# Patient Record
Sex: Male | Born: 1975 | Race: Black or African American | Hispanic: No | Marital: Married | Smoking: Current some day smoker
Health system: Southern US, Community
[De-identification: ages and names within clinical notes are randomized; demographics above are authoritative.]

## PROBLEM LIST (undated history)

## (undated) DIAGNOSIS — S82209A Unspecified fracture of shaft of unspecified tibia, initial encounter for closed fracture: Secondary | ICD-10-CM

## (undated) DIAGNOSIS — T148XXA Other injury of unspecified body region, initial encounter: Secondary | ICD-10-CM

## (undated) HISTORY — PX: NO PAST SURGERIES: SHX2092

---

## 2016-12-15 ENCOUNTER — Emergency Department (HOSPITAL_COMMUNITY): Payer: 59

## 2016-12-15 ENCOUNTER — Encounter (HOSPITAL_COMMUNITY): Payer: Self-pay | Admitting: Emergency Medicine

## 2016-12-15 ENCOUNTER — Inpatient Hospital Stay (HOSPITAL_COMMUNITY)
Admission: EM | Admit: 2016-12-15 | Discharge: 2016-12-24 | DRG: 493 | Disposition: A | Discharge status: Home Health | Payer: 59 | Attending: Orthopedic Surgery | Admitting: Orthopedic Surgery

## 2016-12-15 DIAGNOSIS — S060X9A Concussion with loss of consciousness of unspecified duration, initial encounter: Secondary | ICD-10-CM | POA: Diagnosis present

## 2016-12-15 DIAGNOSIS — S40812A Abrasion of left upper arm, initial encounter: Secondary | ICD-10-CM | POA: Diagnosis present

## 2016-12-15 DIAGNOSIS — Z7982 Long term (current) use of aspirin: Secondary | ICD-10-CM

## 2016-12-15 DIAGNOSIS — T148XXA Other injury of unspecified body region, initial encounter: Secondary | ICD-10-CM | POA: Diagnosis present

## 2016-12-15 DIAGNOSIS — S0083XA Contusion of other part of head, initial encounter: Secondary | ICD-10-CM | POA: Diagnosis present

## 2016-12-15 DIAGNOSIS — F17290 Nicotine dependence, other tobacco product, uncomplicated: Secondary | ICD-10-CM | POA: Diagnosis present

## 2016-12-15 DIAGNOSIS — Z419 Encounter for procedure for purposes other than remedying health state, unspecified: Secondary | ICD-10-CM

## 2016-12-15 DIAGNOSIS — T1490XA Injury, unspecified, initial encounter: Secondary | ICD-10-CM

## 2016-12-15 DIAGNOSIS — S82141A Displaced bicondylar fracture of right tibia, initial encounter for closed fracture: Principal | ICD-10-CM | POA: Diagnosis present

## 2016-12-15 DIAGNOSIS — S0031XA Abrasion of nose, initial encounter: Secondary | ICD-10-CM | POA: Diagnosis present

## 2016-12-15 DIAGNOSIS — S82121A Displaced fracture of lateral condyle of right tibia, initial encounter for closed fracture: Secondary | ICD-10-CM

## 2016-12-15 DIAGNOSIS — S80811A Abrasion, right lower leg, initial encounter: Secondary | ICD-10-CM | POA: Diagnosis present

## 2016-12-15 DIAGNOSIS — S82201A Unspecified fracture of shaft of right tibia, initial encounter for closed fracture: Secondary | ICD-10-CM

## 2016-12-15 DIAGNOSIS — Y92481 Parking lot as the place of occurrence of the external cause: Secondary | ICD-10-CM | POA: Diagnosis not present

## 2016-12-15 DIAGNOSIS — S40811A Abrasion of right upper arm, initial encounter: Secondary | ICD-10-CM | POA: Diagnosis present

## 2016-12-15 DIAGNOSIS — S80812A Abrasion, left lower leg, initial encounter: Secondary | ICD-10-CM | POA: Diagnosis present

## 2016-12-15 DIAGNOSIS — D62 Acute posthemorrhagic anemia: Secondary | ICD-10-CM | POA: Diagnosis not present

## 2016-12-15 DIAGNOSIS — K5900 Constipation, unspecified: Secondary | ICD-10-CM | POA: Diagnosis not present

## 2016-12-15 DIAGNOSIS — Z23 Encounter for immunization: Secondary | ICD-10-CM

## 2016-12-15 DIAGNOSIS — M25512 Pain in left shoulder: Secondary | ICD-10-CM | POA: Diagnosis present

## 2016-12-15 DIAGNOSIS — T07XXXA Unspecified multiple injuries, initial encounter: Secondary | ICD-10-CM

## 2016-12-15 DIAGNOSIS — S82209A Unspecified fracture of shaft of unspecified tibia, initial encounter for closed fracture: Secondary | ICD-10-CM

## 2016-12-15 HISTORY — DX: Unspecified fracture of shaft of unspecified tibia, initial encounter for closed fracture: S82.209A

## 2016-12-15 HISTORY — DX: Other injury of unspecified body region, initial encounter: T14.8XXA

## 2016-12-15 LAB — COMPREHENSIVE METABOLIC PANEL
ALBUMIN: 4.2 g/dL (ref 3.5–5.0)
ALK PHOS: 40 U/L (ref 38–126)
ALT: 46 U/L (ref 17–63)
AST: 61 U/L — AB (ref 15–41)
Anion gap: 11 (ref 5–15)
BILIRUBIN TOTAL: 0.8 mg/dL (ref 0.3–1.2)
BUN: 10 mg/dL (ref 6–20)
CO2: 22 mmol/L (ref 22–32)
CREATININE: 1.23 mg/dL (ref 0.61–1.24)
Calcium: 9.3 mg/dL (ref 8.9–10.3)
Chloride: 104 mmol/L (ref 101–111)
GFR calc Af Amer: >60 mL/min (ref >60)
Glucose, Bld: 107 mg/dL — ABNORMAL HIGH (ref 65–99)
POTASSIUM: 3.3 mmol/L — AB (ref 3.5–5.1)
Sodium: 137 mmol/L (ref 135–145)
TOTAL PROTEIN: 7.2 g/dL (ref 6.5–8.1)

## 2016-12-15 LAB — CBC WITH DIFFERENTIAL/PLATELET
BASOS ABS: 0 10*3/uL (ref 0.0–0.1)
Basophils Relative: 0 %
EOS ABS: 0.1 10*3/uL (ref 0.0–0.7)
EOS PCT: 1 %
HCT: 43.8 % (ref 39.0–52.0)
Hemoglobin: 14.6 g/dL (ref 13.0–17.0)
LYMPHS PCT: 41 %
Lymphs Abs: 3.7 10*3/uL (ref 0.7–4.0)
MCH: 28.1 pg (ref 26.0–34.0)
MCHC: 33.3 g/dL (ref 30.0–36.0)
MCV: 84.2 fL (ref 78.0–100.0)
MONO ABS: 0.7 10*3/uL (ref 0.1–1.0)
Monocytes Relative: 7 %
Neutro Abs: 4.6 10*3/uL (ref 1.7–7.7)
Neutrophils Relative %: 51 %
PLATELETS: 162 10*3/uL (ref 150–400)
RBC: 5.2 MIL/uL (ref 4.22–5.81)
RDW: 13.7 % (ref 11.5–15.5)
WBC: 9 10*3/uL (ref 4.0–10.5)

## 2016-12-15 LAB — I-STAT CHEM 8, ED
BUN: 13 mg/dL (ref 6–20)
CREATININE: 1.2 mg/dL (ref 0.61–1.24)
Calcium, Ion: 1.02 mmol/L — ABNORMAL LOW (ref 1.15–1.40)
Chloride: 103 mmol/L (ref 101–111)
GLUCOSE: 105 mg/dL — AB (ref 65–99)
HEMATOCRIT: 46 % (ref 39.0–52.0)
HEMOGLOBIN: 15.6 g/dL (ref 13.0–17.0)
Potassium: 3.2 mmol/L — ABNORMAL LOW (ref 3.5–5.1)
Sodium: 138 mmol/L (ref 135–145)
TCO2: 23 mmol/L (ref 0–100)

## 2016-12-15 LAB — I-STAT CG4 LACTIC ACID, ED: Lactic Acid, Venous: 3.07 mmol/L (ref 0.5–1.9)

## 2016-12-15 LAB — ETHANOL: ALCOHOL ETHYL (B): 32 mg/dL — AB (ref <5)

## 2016-12-15 MED ORDER — FENTANYL CITRATE (PF) 100 MCG/2ML IJ SOLN
100.0000 ug | Freq: Once | INTRAMUSCULAR | Status: AC
  Administered 2016-12-15: 100 ug via INTRAVENOUS

## 2016-12-15 MED ORDER — HYDROMORPHONE HCL 1 MG/ML IJ SOLN
1.0000 mg | Freq: Once | INTRAMUSCULAR | Status: AC
  Administered 2016-12-15: 1 mg via INTRAVENOUS

## 2016-12-15 MED ORDER — TETANUS-DIPHTH-ACELL PERTUSSIS 5-2.5-18.5 LF-MCG/0.5 IM SUSP
0.5000 mL | Freq: Once | INTRAMUSCULAR | Status: AC
  Administered 2016-12-15: 0.5 mL via INTRAMUSCULAR

## 2016-12-15 MED ORDER — CEFAZOLIN SODIUM-DEXTROSE 1-4 GM/50ML-% IV SOLN
1.0000 g | Freq: Once | INTRAVENOUS | Status: AC
  Administered 2016-12-15: 1 g via INTRAVENOUS
  Filled 2016-12-15: qty 50

## 2016-12-15 MED ORDER — ONDANSETRON HCL 4 MG/2ML IJ SOLN
INTRAMUSCULAR | Status: AC
  Filled 2016-12-15: qty 2

## 2016-12-15 MED ORDER — ONDANSETRON HCL 4 MG/2ML IJ SOLN
4.0000 mg | Freq: Once | INTRAMUSCULAR | Status: AC
  Administered 2016-12-15: 4 mg via INTRAVENOUS

## 2016-12-15 MED ORDER — HYDROMORPHONE HCL 1 MG/ML IJ SOLN
INTRAMUSCULAR | Status: AC
  Filled 2016-12-15: qty 1

## 2016-12-15 NOTE — ED Notes (Signed)
Pt requesting pain meds again.  Dr. Rhunette CroftNanavati notified again and is now at bedside.
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
## 2016-12-15 NOTE — ED Notes (Signed)
Pts Wife called she is on the way from [place], will be here in about 6 hrs. Told her nurse will call to give update once the pt was stable, Jordan Furlongtara Townsend [phone].
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
## 2016-12-15 NOTE — ED Notes (Signed)
Portable x-rays being completed at this time. 

## 2016-12-15 NOTE — ED Notes (Signed)
Pt c/o nausea.  

## 2016-12-15 NOTE — ED Notes (Signed)
Pt to xray

## 2016-12-15 NOTE — ED Notes (Signed)
Returned from CT.

## 2016-12-15 NOTE — ED Notes (Addendum)
Ice pack applied to R knee @ 2240

## 2016-12-15 NOTE — ED Notes (Signed)
Pt to CT.  Wife has been notified and [facility]  Co-worker came to bedside to speak to pt prior to CT

## 2016-12-15 NOTE — ED Notes (Signed)
Pt requesting more pain medication.  Dr. Rhunette CroftNanavati notified of same.  Pt also asked for his R leg to be slid over in the bed.  I told pt he could move leg over and he states he is unable to move it.  I encouraged pt to move leg over and informed him that he was moving it earlier.  He states that he hurts too bad to move it.  Pt wiggles toes and sensation intact.  Dr. Rhunette CroftNanavati updated.
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
## 2016-12-16 ENCOUNTER — Inpatient Hospital Stay (HOSPITAL_COMMUNITY): Payer: 59

## 2016-12-16 ENCOUNTER — Encounter (HOSPITAL_COMMUNITY)
Admission: EM | Disposition: A | Discharge status: Home Health | Payer: Self-pay | Source: Home / Self Care | Attending: Orthopedic Surgery

## 2016-12-16 ENCOUNTER — Inpatient Hospital Stay (HOSPITAL_COMMUNITY): Payer: 59 | Admitting: Certified Registered Nurse Anesthetist

## 2016-12-16 ENCOUNTER — Encounter (HOSPITAL_COMMUNITY): Payer: Self-pay | Admitting: *Deleted

## 2016-12-16 ENCOUNTER — Emergency Department (HOSPITAL_COMMUNITY): Payer: 59

## 2016-12-16 DIAGNOSIS — T1490XA Injury, unspecified, initial encounter: Secondary | ICD-10-CM | POA: Diagnosis not present

## 2016-12-16 DIAGNOSIS — S82141A Displaced bicondylar fracture of right tibia, initial encounter for closed fracture: Secondary | ICD-10-CM | POA: Diagnosis not present

## 2016-12-16 DIAGNOSIS — S82121A Displaced fracture of lateral condyle of right tibia, initial encounter for closed fracture: Secondary | ICD-10-CM

## 2016-12-16 DIAGNOSIS — M25512 Pain in left shoulder: Secondary | ICD-10-CM | POA: Diagnosis present

## 2016-12-16 DIAGNOSIS — S82209A Unspecified fracture of shaft of unspecified tibia, initial encounter for closed fracture: Secondary | ICD-10-CM | POA: Insufficient documentation

## 2016-12-16 HISTORY — PX: EXTERNAL FIXATION LEG: SHX1549

## 2016-12-16 LAB — CBC
HCT: 38.4 % — ABNORMAL LOW (ref 39.0–52.0)
Hemoglobin: 12.9 g/dL — ABNORMAL LOW (ref 13.0–17.0)
MCH: 28.2 pg (ref 26.0–34.0)
MCHC: 33.6 g/dL (ref 30.0–36.0)
MCV: 84 fL (ref 78.0–100.0)
PLATELETS: 160 10*3/uL (ref 150–400)
RBC: 4.57 MIL/uL (ref 4.22–5.81)
RDW: 13.8 % (ref 11.5–15.5)
WBC: 10.9 10*3/uL — AB (ref 4.0–10.5)

## 2016-12-16 LAB — SURGICAL PCR SCREEN
MRSA, PCR: NEGATIVE
STAPHYLOCOCCUS AUREUS: POSITIVE — AB

## 2016-12-16 LAB — CREATININE, SERUM
CREATININE: 1.26 mg/dL — AB (ref 0.61–1.24)
GFR calc non Af Amer: >60 mL/min (ref >60)

## 2016-12-16 LAB — HIV ANTIBODY (ROUTINE TESTING W REFLEX): HIV Screen 4th Generation wRfx: NONREACTIVE

## 2016-12-16 SURGERY — EXTERNAL FIXATION, LOWER EXTREMITY
Anesthesia: General | Site: Leg Lower | Laterality: Right

## 2016-12-16 MED ORDER — DEXAMETHASONE SODIUM PHOSPHATE 10 MG/ML IJ SOLN
INTRAMUSCULAR | Status: AC
  Filled 2016-12-16: qty 1

## 2016-12-16 MED ORDER — OXYCODONE-ACETAMINOPHEN 5-325 MG PO TABS: 1.0000 | ORAL_TABLET | ORAL | 0 refills | Status: AC | PRN

## 2016-12-16 MED ORDER — ONDANSETRON HCL 4 MG/2ML IJ SOLN: 4.0000 mg | Freq: Four times a day (QID) | INTRAMUSCULAR | Status: DC | PRN

## 2016-12-16 MED ORDER — SORBITOL 70 % SOLN
30.0000 mL | Freq: Every day | Status: DC | PRN
  Administered 2016-12-22: 30 mL via ORAL
  Filled 2016-12-16: qty 30

## 2016-12-16 MED ORDER — DEXAMETHASONE SODIUM PHOSPHATE 10 MG/ML IJ SOLN
INTRAMUSCULAR | Status: DC | PRN
  Administered 2016-12-16: 10 mg via INTRAVENOUS

## 2016-12-16 MED ORDER — ASPIRIN EC 325 MG PO TBEC: 325.0000 mg | DELAYED_RELEASE_TABLET | Freq: Every day | ORAL | 0 refills | Status: AC

## 2016-12-16 MED ORDER — DEXTROSE 5 % IV SOLN
500.0000 mg | Freq: Four times a day (QID) | INTRAVENOUS | Status: DC | PRN
  Filled 2016-12-16: qty 5

## 2016-12-16 MED ORDER — FENTANYL CITRATE (PF) 250 MCG/5ML IJ SOLN
INTRAMUSCULAR | Status: AC
  Filled 2016-12-16: qty 5

## 2016-12-16 MED ORDER — DOCUSATE SODIUM 100 MG PO CAPS
100.0000 mg | ORAL_CAPSULE | Freq: Two times a day (BID) | ORAL | Status: DC
Start: 2016-12-16 — End: 2016-12-24
  Administered 2016-12-16 – 2016-12-24 (×15): 100 mg via ORAL
  Filled 2016-12-16 (×16): qty 1

## 2016-12-16 MED ORDER — LIDOCAINE 2% (20 MG/ML) 5 ML SYRINGE
INTRAMUSCULAR | Status: DC | PRN
  Administered 2016-12-16: 100 mg via INTRAVENOUS

## 2016-12-16 MED ORDER — CEFAZOLIN SODIUM-DEXTROSE 2-4 GM/100ML-% IV SOLN
INTRAVENOUS | Status: AC
  Filled 2016-12-16: qty 100

## 2016-12-16 MED ORDER — SUCCINYLCHOLINE CHLORIDE 200 MG/10ML IV SOSY
PREFILLED_SYRINGE | INTRAVENOUS | Status: AC
Start: 2016-12-16 — End: 2016-12-16
  Filled 2016-12-16: qty 10

## 2016-12-16 MED ORDER — PROPOFOL 10 MG/ML IV BOLUS
INTRAVENOUS | Status: AC
  Filled 2016-12-16: qty 20

## 2016-12-16 MED ORDER — ONDANSETRON HCL 4 MG PO TABS: 4.0000 mg | ORAL_TABLET | Freq: Three times a day (TID) | ORAL | 0 refills | Status: AC | PRN

## 2016-12-16 MED ORDER — MORPHINE SULFATE (PF) 4 MG/ML IV SOLN
2.0000 mg | INTRAVENOUS | Status: DC | PRN
  Administered 2016-12-16 (×2): 2 mg via INTRAVENOUS
  Filled 2016-12-16 (×2): qty 1

## 2016-12-16 MED ORDER — ACETAMINOPHEN 325 MG PO TABS
650.0000 mg | ORAL_TABLET | Freq: Four times a day (QID) | ORAL | Status: DC | PRN
  Administered 2016-12-20 – 2016-12-23 (×2): 650 mg via ORAL
  Filled 2016-12-16 (×3): qty 2

## 2016-12-16 MED ORDER — LACTATED RINGERS IV SOLN
INTRAVENOUS | Status: DC
  Administered 2016-12-16 – 2016-12-21 (×10): via INTRAVENOUS

## 2016-12-16 MED ORDER — ENOXAPARIN SODIUM 30 MG/0.3ML ~~LOC~~ SOLN: 30.0000 mg | Freq: Two times a day (BID) | SUBCUTANEOUS | Status: DC

## 2016-12-16 MED ORDER — DIPHENHYDRAMINE HCL 12.5 MG/5ML PO ELIX: 12.5000 mg | ORAL_SOLUTION | ORAL | Status: DC | PRN

## 2016-12-16 MED ORDER — OXYCODONE HCL 5 MG PO TABS
ORAL_TABLET | ORAL | Status: AC
  Filled 2016-12-16: qty 2

## 2016-12-16 MED ORDER — METHOCARBAMOL 500 MG PO TABS: 500.0000 mg | ORAL_TABLET | Freq: Four times a day (QID) | ORAL | 0 refills | Status: AC | PRN

## 2016-12-16 MED ORDER — LACTATED RINGERS IV SOLN
INTRAVENOUS | Status: DC
  Administered 2016-12-16: 04:00:00 via INTRAVENOUS

## 2016-12-16 MED ORDER — OXYCODONE HCL 5 MG PO TABS
ORAL_TABLET | ORAL | Status: AC
  Filled 2016-12-16: qty 1

## 2016-12-16 MED ORDER — ROCURONIUM BROMIDE 10 MG/ML (PF) SYRINGE
PREFILLED_SYRINGE | INTRAVENOUS | Status: AC
  Filled 2016-12-16: qty 5

## 2016-12-16 MED ORDER — METOCLOPRAMIDE HCL 5 MG/ML IJ SOLN: 5.0000 mg | Freq: Three times a day (TID) | INTRAMUSCULAR | Status: DC | PRN

## 2016-12-16 MED ORDER — KETOROLAC TROMETHAMINE 15 MG/ML IJ SOLN
15.0000 mg | Freq: Four times a day (QID) | INTRAMUSCULAR | Status: AC | PRN
  Administered 2016-12-16 – 2016-12-19 (×2): 15 mg via INTRAVENOUS
  Filled 2016-12-16: qty 1

## 2016-12-16 MED ORDER — LACTATED RINGERS IV SOLN
INTRAVENOUS | Status: DC
  Administered 2016-12-16: 11:00:00 via INTRAVENOUS

## 2016-12-16 MED ORDER — METHOCARBAMOL 500 MG PO TABS
500.0000 mg | ORAL_TABLET | Freq: Four times a day (QID) | ORAL | Status: DC | PRN
  Administered 2016-12-16: 500 mg via ORAL
  Filled 2016-12-16: qty 1

## 2016-12-16 MED ORDER — SUCCINYLCHOLINE CHLORIDE 200 MG/10ML IV SOSY
PREFILLED_SYRINGE | INTRAVENOUS | Status: DC | PRN
  Administered 2016-12-16: 120 mg via INTRAVENOUS

## 2016-12-16 MED ORDER — POLYETHYLENE GLYCOL 3350 17 G PO PACK
17.0000 g | PACK | Freq: Every day | ORAL | Status: DC | PRN
  Administered 2016-12-18: 17 g via ORAL
  Filled 2016-12-16: qty 1

## 2016-12-16 MED ORDER — MUPIROCIN 2 % EX OINT
TOPICAL_OINTMENT | Freq: Two times a day (BID) | CUTANEOUS | Status: DC
  Administered 2016-12-16 – 2016-12-20 (×7): via NASAL
  Administered 2016-12-20: 1 via NASAL
  Administered 2016-12-22 – 2016-12-24 (×5): via NASAL
  Filled 2016-12-16 (×2): qty 22

## 2016-12-16 MED ORDER — CEFAZOLIN SODIUM-DEXTROSE 2-4 GM/100ML-% IV SOLN
2.0000 g | Freq: Four times a day (QID) | INTRAVENOUS | Status: AC
  Administered 2016-12-16 – 2016-12-17 (×3): 2 g via INTRAVENOUS
  Filled 2016-12-16 (×3): qty 100

## 2016-12-16 MED ORDER — SENNA 8.6 MG PO TABS
1.0000 | ORAL_TABLET | Freq: Two times a day (BID) | ORAL | Status: DC
  Administered 2016-12-16 – 2016-12-24 (×14): 8.6 mg via ORAL
  Filled 2016-12-16 (×14): qty 1

## 2016-12-16 MED ORDER — SUGAMMADEX SODIUM 200 MG/2ML IV SOLN
INTRAVENOUS | Status: DC | PRN
  Administered 2016-12-16: 200 mg via INTRAVENOUS

## 2016-12-16 MED ORDER — METOCLOPRAMIDE HCL 5 MG PO TABS: 5.0000 mg | ORAL_TABLET | Freq: Three times a day (TID) | ORAL | Status: DC | PRN

## 2016-12-16 MED ORDER — CLONIDINE HCL 0.2 MG PO TABS: 0.2000 mg | ORAL_TABLET | Freq: Four times a day (QID) | ORAL | Status: DC | PRN

## 2016-12-16 MED ORDER — ROCURONIUM BROMIDE 10 MG/ML (PF) SYRINGE
PREFILLED_SYRINGE | INTRAVENOUS | Status: DC | PRN
  Administered 2016-12-16: 50 mg via INTRAVENOUS

## 2016-12-16 MED ORDER — ONDANSETRON HCL 4 MG PO TABS: 4.0000 mg | ORAL_TABLET | Freq: Four times a day (QID) | ORAL | Status: DC | PRN

## 2016-12-16 MED ORDER — ONDANSETRON HCL 4 MG/2ML IJ SOLN
INTRAMUSCULAR | Status: AC
  Filled 2016-12-16: qty 2

## 2016-12-16 MED ORDER — HYDROMORPHONE HCL 1 MG/ML IJ SOLN
0.5000 mg | INTRAMUSCULAR | Status: DC | PRN
  Administered 2016-12-16 – 2016-12-19 (×2): 1 mg via INTRAVENOUS
  Administered 2016-12-21: 0.5 mg via INTRAVENOUS
  Filled 2016-12-16 (×2): qty 1

## 2016-12-16 MED ORDER — ENOXAPARIN SODIUM 40 MG/0.4ML ~~LOC~~ SOLN
40.0000 mg | SUBCUTANEOUS | Status: DC
  Administered 2016-12-17 – 2016-12-24 (×7): 40 mg via SUBCUTANEOUS
  Filled 2016-12-16 (×7): qty 0.4

## 2016-12-16 MED ORDER — FENTANYL CITRATE (PF) 100 MCG/2ML IJ SOLN
INTRAMUSCULAR | Status: DC | PRN
  Administered 2016-12-16 (×4): 50 ug via INTRAVENOUS

## 2016-12-16 MED ORDER — PROPOFOL 10 MG/ML IV BOLUS
INTRAVENOUS | Status: DC | PRN
Start: 2016-12-16 — End: 2016-12-16
  Administered 2016-12-16: 200 mg via INTRAVENOUS

## 2016-12-16 MED ORDER — MIDAZOLAM HCL 2 MG/2ML IJ SOLN
INTRAMUSCULAR | Status: AC
  Filled 2016-12-16: qty 2

## 2016-12-16 MED ORDER — POVIDONE-IODINE 10 % EX SWAB: 2.0000 "application " | Freq: Once | CUTANEOUS | Status: DC

## 2016-12-16 MED ORDER — CEFAZOLIN SODIUM-DEXTROSE 2-4 GM/100ML-% IV SOLN
2.0000 g | INTRAVENOUS | Status: AC
  Administered 2016-12-16: 2 g via INTRAVENOUS
  Filled 2016-12-16: qty 100

## 2016-12-16 MED ORDER — ACETAMINOPHEN 650 MG RE SUPP: 650.0000 mg | Freq: Four times a day (QID) | RECTAL | Status: DC | PRN

## 2016-12-16 MED ORDER — ACETAMINOPHEN 500 MG PO TABS
1000.0000 mg | ORAL_TABLET | Freq: Three times a day (TID) | ORAL | Status: AC
  Administered 2016-12-16 – 2016-12-17 (×3): 1000 mg via ORAL
  Filled 2016-12-16 (×3): qty 2

## 2016-12-16 MED ORDER — METHOCARBAMOL 1000 MG/10ML IJ SOLN
500.0000 mg | Freq: Four times a day (QID) | INTRAVENOUS | Status: DC | PRN
  Filled 2016-12-16: qty 5

## 2016-12-16 MED ORDER — SUGAMMADEX SODIUM 200 MG/2ML IV SOLN
INTRAVENOUS | Status: AC
  Filled 2016-12-16: qty 2

## 2016-12-16 MED ORDER — FENTANYL CITRATE (PF) 100 MCG/2ML IJ SOLN
INTRAMUSCULAR | Status: AC
  Filled 2016-12-16: qty 2

## 2016-12-16 MED ORDER — SODIUM CHLORIDE 0.9 % IR SOLN
Status: DC | PRN
  Administered 2016-12-16: 1000 mL

## 2016-12-16 MED ORDER — MAGNESIUM CITRATE PO SOLN
1.0000 | Freq: Once | ORAL | Status: DC | PRN
  Filled 2016-12-16 (×2): qty 296

## 2016-12-16 MED ORDER — CHLORHEXIDINE GLUCONATE 4 % EX LIQD: 60.0000 mL | Freq: Once | CUTANEOUS | Status: DC

## 2016-12-16 MED ORDER — ONDANSETRON HCL 4 MG/2ML IJ SOLN
INTRAMUSCULAR | Status: DC | PRN
  Administered 2016-12-16: 4 mg via INTRAVENOUS

## 2016-12-16 MED ORDER — DOCUSATE SODIUM 100 MG PO CAPS: 100.0000 mg | ORAL_CAPSULE | Freq: Two times a day (BID) | ORAL | 0 refills | Status: AC

## 2016-12-16 MED ORDER — METHOCARBAMOL 500 MG PO TABS
500.0000 mg | ORAL_TABLET | Freq: Four times a day (QID) | ORAL | Status: DC | PRN
  Administered 2016-12-16 – 2016-12-19 (×4): 500 mg via ORAL
  Filled 2016-12-16 (×4): qty 1

## 2016-12-16 MED ORDER — LIDOCAINE 2% (20 MG/ML) 5 ML SYRINGE
INTRAMUSCULAR | Status: AC
  Filled 2016-12-16: qty 5

## 2016-12-16 MED ORDER — KETOROLAC TROMETHAMINE 15 MG/ML IJ SOLN
INTRAMUSCULAR | Status: AC
  Filled 2016-12-16: qty 1

## 2016-12-16 MED ORDER — FENTANYL CITRATE (PF) 100 MCG/2ML IJ SOLN
25.0000 ug | INTRAMUSCULAR | Status: DC | PRN
  Administered 2016-12-16 (×2): 50 ug via INTRAVENOUS

## 2016-12-16 MED ORDER — OXYCODONE HCL 5 MG PO TABS
5.0000 mg | ORAL_TABLET | ORAL | Status: DC | PRN
  Administered 2016-12-16: 10 mg via ORAL
  Administered 2016-12-16: 5 mg via ORAL
  Administered 2016-12-16 – 2016-12-19 (×11): 15 mg via ORAL
  Administered 2016-12-19: 10 mg via ORAL
  Administered 2016-12-19 – 2016-12-21 (×7): 15 mg via ORAL
  Filled 2016-12-16 (×19): qty 3

## 2016-12-16 SURGICAL SUPPLY — 64 items
BANDAGE ACE 4X5 VEL STRL LF (GAUZE/BANDAGES/DRESSINGS) ×2 IMPLANT
BANDAGE ACE 6X5 VEL STRL LF (GAUZE/BANDAGES/DRESSINGS) ×6 IMPLANT
BANDAGE ESMARK 6X9 LF (GAUZE/BANDAGES/DRESSINGS) ×1 IMPLANT
BNDG COHESIVE 6X5 TAN STRL LF (GAUZE/BANDAGES/DRESSINGS) ×2 IMPLANT
BNDG ESMARK 6X9 LF (GAUZE/BANDAGES/DRESSINGS) ×2
BNDG GAUZE ELAST 4 BULKY (GAUZE/BANDAGES/DRESSINGS) ×6 IMPLANT
CLEANER TIP ELECTROSURG 2X2 (MISCELLANEOUS) ×2 IMPLANT
COVER SURGICAL LIGHT HANDLE (MISCELLANEOUS) ×2 IMPLANT
CUFF TOURNIQUET SINGLE 18IN (TOURNIQUET CUFF) IMPLANT
CUFF TOURNIQUET SINGLE 24IN (TOURNIQUET CUFF) IMPLANT
CUFF TOURNIQUET SINGLE 34IN LL (TOURNIQUET CUFF) IMPLANT
DRAPE C-ARM 42X72 X-RAY (DRAPES) ×2 IMPLANT
DRAPE C-ARMOR (DRAPES) ×2 IMPLANT
DRAPE U-SHAPE 47X51 STRL (DRAPES) ×2 IMPLANT
DRESSING ALLEVYN LIFE SACRUM (GAUZE/BANDAGES/DRESSINGS) ×2 IMPLANT
DRSG ADAPTIC 3X8 NADH LF (GAUZE/BANDAGES/DRESSINGS) IMPLANT
DRSG PAD ABDOMINAL 8X10 ST (GAUZE/BANDAGES/DRESSINGS) ×2 IMPLANT
ELECT REM PT RETURN 9FT ADLT (ELECTROSURGICAL) ×2
ELECTRODE REM PT RTRN 9FT ADLT (ELECTROSURGICAL) ×1 IMPLANT
EVACUATOR 1/8 PVC DRAIN (DRAIN) IMPLANT
GAUZE SPONGE 4X4 12PLY STRL (GAUZE/BANDAGES/DRESSINGS) ×2 IMPLANT
GAUZE XEROFORM 5X9 LF (GAUZE/BANDAGES/DRESSINGS) ×6 IMPLANT
GLOVE BIOGEL PI ORTHO PRO SZ8 (GLOVE) ×2
GLOVE ORTHO TXT STRL SZ7.5 (GLOVE) ×2 IMPLANT
GLOVE PI ORTHO PRO STRL SZ8 (GLOVE) ×2 IMPLANT
GLOVE SURG ORTHO 8.0 STRL STRW (GLOVE) ×2 IMPLANT
GOWN STRL REUS W/ TWL XL LVL3 (GOWN DISPOSABLE) ×1 IMPLANT
GOWN STRL REUS W/TWL 2XL LVL3 (GOWN DISPOSABLE) IMPLANT
GOWN STRL REUS W/TWL XL LVL3 (GOWN DISPOSABLE) ×1
HANDPIECE INTERPULSE COAX TIP (DISPOSABLE)
KIT BASIN OR (CUSTOM PROCEDURE TRAY) ×2 IMPLANT
KIT ROOM TURNOVER OR (KITS) ×2 IMPLANT
MANIFOLD NEPTUNE II (INSTRUMENTS) IMPLANT
NEEDLE 22X1 1/2 (OR ONLY) (NEEDLE) IMPLANT
NS IRRIG 1000ML POUR BTL (IV SOLUTION) ×2 IMPLANT
PACK ORTHO EXTREMITY (CUSTOM PROCEDURE TRAY) ×2 IMPLANT
PAD ABD 8X10 STRL (GAUZE/BANDAGES/DRESSINGS) ×4 IMPLANT
PAD ARMBOARD 7.5X6 YLW CONV (MISCELLANEOUS) ×4 IMPLANT
PADDING CAST COTTON 6X4 STRL (CAST SUPPLIES) IMPLANT
PIN APEX 5X150 (EXFIX) ×4 IMPLANT
PIN APEX 6X180MM EXFIX (EXFIX) ×4 IMPLANT
PIN CLAMP 5H 30DEG POST (EXFIX) ×4 IMPLANT
ROD HOFFMANN3 CONNECT 11X500 (EXFIX) ×4 IMPLANT
ROD TO ROD COUPLING EXFIX (EXFIX) ×8 IMPLANT
SET HNDPC FAN SPRY TIP SCT (DISPOSABLE) IMPLANT
SPONGE LAP 18X18 X RAY DECT (DISPOSABLE) ×2 IMPLANT
STAPLER VISISTAT 35W (STAPLE) IMPLANT
STOCKINETTE 6  STRL (DRAPES) ×1
STOCKINETTE 6 STRL (DRAPES) ×1 IMPLANT
STOCKINETTE IMPERVIOUS LG (DRAPES) ×4 IMPLANT
SUCTION FRAZIER HANDLE 10FR (MISCELLANEOUS)
SUCTION TUBE FRAZIER 10FR DISP (MISCELLANEOUS) IMPLANT
SUT ETHILON 3 0 PS 1 (SUTURE) IMPLANT
SUT VIC AB 0 CT1 27 (SUTURE)
SUT VIC AB 0 CT1 27XBRD ANBCTR (SUTURE) IMPLANT
SUT VIC AB 2-0 CT1 27 (SUTURE)
SUT VIC AB 2-0 CT1 TAPERPNT 27 (SUTURE) IMPLANT
SYR CONTROL 10ML LL (SYRINGE) IMPLANT
TOWEL OR 17X24 6PK STRL BLUE (TOWEL DISPOSABLE) ×2 IMPLANT
TOWEL OR 17X26 10 PK STRL BLUE (TOWEL DISPOSABLE) ×2 IMPLANT
TUBE CONNECTING 12X1/4 (SUCTIONS) ×2 IMPLANT
UNDERPAD 30X30 (UNDERPADS AND DIAPERS) ×2 IMPLANT
WATER STERILE IRR 1000ML POUR (IV SOLUTION) ×4 IMPLANT
YANKAUER SUCT BULB TIP NO VENT (SUCTIONS) ×2 IMPLANT
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
## 2016-12-16 NOTE — ED Notes (Signed)
Pt to CT

## 2016-12-16 NOTE — Progress Notes (Signed)
Spoke to RN concerning patient's fixators. Fixators have not been tested in a MR environment, spoke with Dr. Sunday Spillersalessio and he feels the scan is not safe to go forward with while patient has fixators.

## 2016-12-16 NOTE — Anesthesia Preprocedure Evaluation (Addendum)
Anesthesia Evaluation  Patient identified by MRN, date of birth, ID band Patient awake    Reviewed: Allergy & Precautions, NPO status , Patient's Chart, lab work & pertinent test results  Airway Mallampati: II  TM Distance: >3 FB     Dental   Pulmonary Current Smoker,    breath sounds clear to auscultation       Cardiovascular negative cardio ROS   Rhythm:Regular Rate:Normal     Neuro/Psych    GI/Hepatic negative GI ROS, Neg liver ROS,   Endo/Other  negative endocrine ROS  Renal/GU negative Renal ROS     Musculoskeletal   Abdominal   Peds  Hematology   Anesthesia Other Findings   Reproductive/Obstetrics                             Anesthesia Physical Anesthesia Plan  ASA: III  Anesthesia Plan: General   Post-op Pain Management:    Induction: Intravenous  PONV Risk Score and Plan: 2 and Ondansetron and Dexamethasone  Airway Management Planned: LMA and Oral ETT  Additional Equipment:   Intra-op Plan:   Post-operative Plan: Extubation in OR  Informed Consent: I have reviewed the patients History and Physical, chart, labs and discussed the procedure including the risks, benefits and alternatives for the proposed anesthesia with the patient or authorized representative who has indicated his/her understanding and acceptance.   Dental advisory given  Plan Discussed with: CRNA and Anesthesiologist  Anesthesia Plan Comments:         Anesthesia Quick Evaluation

## 2016-12-16 NOTE — Discharge Instructions (Signed)
Elevate leg and apply ice as much as possible to reduce pain and swelling.  Diet: As you were doing prior to hospitalization   Shower:  May shower but keep the wounds dry, use an occlusive plastic wrap, NO SOAKING IN TUB.  If the bandage gets wet, change with a clean dry gauze.  If you have a splint on, leave the splint in place and keep the splint dry with a plastic bag.  Dressing:  Keep dressings on and in place until your follow up in the office.  Activity:  Increase activity slowly as tolerated, but follow the weight bearing instructions below.  The rules on driving is that you can not be taking narcotics while you drive, and you must feel in control of the vehicle.    Weight Bearing:   Non weight bearing right leg  To prevent constipation: you may use a stool softener such as -  Colace (over the counter) 100 mg by mouth twice a day  Drink plenty of fluids (prune juice may be helpful) and high fiber foods Miralax (over the counter) for constipation as needed.    Itching:  If you experience itching with your medications, try taking only a single pain pill, or even half a pain pill at a time.  You may take up to 10 pain pills per day, and you can also use benadryl over the counter for itching or also to help with sleep.   Precautions:  If you experience chest pain or shortness of breath - call 911 immediately for transfer to the hospital emergency department!!  If you develop a fever greater that 101 F, purulent drainage from wound, increased redness or drainage from wound, or calf pain -- Call the office at [phone]                                                Follow- Up Appointment:  [facility] [phone]
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
## 2016-12-16 NOTE — ED Notes (Signed)
Dr. Nanavati at bedside 

## 2016-12-16 NOTE — Anesthesia Procedure Notes (Signed)
Procedure Name: Intubation Date/Time: 12/16/2016 12:45 PM Performed by: Candis Shine Pre-anesthesia Checklist: Patient identified, Emergency Drugs available, Suction available and Patient being monitored Patient Re-evaluated:Patient Re-evaluated prior to inductionOxygen Delivery Method: Circle System Utilized Preoxygenation: Pre-oxygenation with 100% oxygen Intubation Type: IV induction Laryngoscope Size: Mac and 4 Grade View: Grade I Tube type: Oral Tube size: 7.5 mm Number of attempts: 1 Airway Equipment and Method: Stylet Placement Confirmation: ETT inserted through vocal cords under direct vision,  positive ETCO2 and breath sounds checked- equal and bilateral Secured at: 22 cm Tube secured with: Tape Dental Injury: Teeth and Oropharynx as per pre-operative assessment

## 2016-12-16 NOTE — Progress Notes (Signed)
Orthopedic Tech Progress Note Patient Details:  Jordan LenisDerrick Townsend [birth year] [id number]  Ortho Devices Type of Ortho Device: Knee Sleeve Ortho Device/Splint Location: rle Ortho Device/Splint Interventions: Ordered, Application, Adjustment   Jordan Townsend, Jordan Townsend 12/16/2016, 12:33 AM
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
## 2016-12-16 NOTE — Anesthesia Postprocedure Evaluation (Signed)
Anesthesia Post Note  Patient: Jordan LenisDerrick Pechacek  Procedure(s) Performed: Procedure(s) (LRB): EXTERNAL FIXATION of right KNEE (Right)     Patient location during evaluation: PACU Anesthesia Type: General Level of consciousness: awake Pain management: pain level controlled Respiratory status: spontaneous breathing Cardiovascular status: stable Anesthetic complications: no    Last Vitals:  Vitals:   12/16/16 1445 12/16/16 1455  BP: (!) 147/98   Pulse: 95 (!) 105  Resp: 20 17  Temp:  36.7 C    Last Pain:  Vitals:   12/16/16 1516  TempSrc:   PainSc: 0-No pain                 Madina Galati

## 2016-12-16 NOTE — Transfer of Care (Signed)
Immediate Anesthesia Transfer of Care Note  Patient: Jordan Townsend  Procedure(s) Performed: Procedure(s): EXTERNAL FIXATION of right KNEE (Right)  Patient Location: PACU  Anesthesia Type:General  Level of Consciousness: awake, alert  and oriented  Airway & Oxygen Therapy: Patient Spontanous Breathing and Patient connected to nasal cannula oxygen  Post-op Assessment: Report given to RN and Post -op Vital signs reviewed and stable  Post vital signs: Reviewed and stable  Last Vitals:  Vitals:   12/16/16 1048 12/16/16 1340  BP: 132/77 (!) (P) 142/91  Pulse: (!) 108 (P) 94  Resp: 18 (P) 18  Temp: 37.6 C (P) 36.8 C    Last Pain:  Vitals:   12/16/16 1048  TempSrc: Oral  PainSc:          Complications: No apparent anesthesia complications

## 2016-12-16 NOTE — Progress Notes (Signed)
Report given to robin roberts rn s caregiver

## 2016-12-17 ENCOUNTER — Encounter (HOSPITAL_COMMUNITY): Payer: Self-pay | Admitting: Orthopedic Surgery

## 2016-12-17 LAB — BASIC METABOLIC PANEL
Anion gap: 9 (ref 5–15)
BUN: 15 mg/dL (ref 6–20)
CALCIUM: 8.8 mg/dL — AB (ref 8.9–10.3)
CO2: 27 mmol/L (ref 22–32)
Chloride: 101 mmol/L (ref 101–111)
Creatinine, Ser: 1.2 mg/dL (ref 0.61–1.24)
GFR calc Af Amer: >60 mL/min (ref >60)
GLUCOSE: 127 mg/dL — AB (ref 65–99)
POTASSIUM: 4.5 mmol/L (ref 3.5–5.1)
SODIUM: 137 mmol/L (ref 135–145)

## 2016-12-17 LAB — CBC
HEMATOCRIT: 35 % — AB (ref 39.0–52.0)
Hemoglobin: 11.4 g/dL — ABNORMAL LOW (ref 13.0–17.0)
MCH: 27.7 pg (ref 26.0–34.0)
MCHC: 32.6 g/dL (ref 30.0–36.0)
MCV: 85 fL (ref 78.0–100.0)
PLATELETS: 159 10*3/uL (ref 150–400)
RBC: 4.12 MIL/uL — ABNORMAL LOW (ref 4.22–5.81)
RDW: 14.1 % (ref 11.5–15.5)
WBC: 11.8 10*3/uL — AB (ref 4.0–10.5)

## 2016-12-17 MED ORDER — ACETAMINOPHEN 500 MG PO TABS
1000.0000 mg | ORAL_TABLET | Freq: Three times a day (TID) | ORAL | Status: AC
  Administered 2016-12-17 – 2016-12-19 (×7): 1000 mg via ORAL
  Filled 2016-12-17 (×9): qty 2

## 2016-12-17 MED ORDER — DOUBLE ANTIBIOTIC 500-10000 UNIT/GM EX OINT
TOPICAL_OINTMENT | Freq: Every day | CUTANEOUS | Status: DC | PRN
  Administered 2016-12-20: 09:00:00 via TOPICAL
  Filled 2016-12-17 (×2): qty 1
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
## 2016-12-17 NOTE — Progress Notes (Signed)
Placed Xeroform on patient's right elbow, ABD and Kurlex.
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
## 2016-12-18 LAB — CBC
HCT: 28.2 % — ABNORMAL LOW (ref 39.0–52.0)
HEMATOCRIT: 26.7 % — AB (ref 39.0–52.0)
HEMOGLOBIN: 9.2 g/dL — AB (ref 13.0–17.0)
Hemoglobin: 8.8 g/dL — ABNORMAL LOW (ref 13.0–17.0)
MCH: 27.5 pg (ref 26.0–34.0)
MCH: 28.2 pg (ref 26.0–34.0)
MCHC: 32.6 g/dL (ref 30.0–36.0)
MCHC: 33 g/dL (ref 30.0–36.0)
MCV: 84.2 fL (ref 78.0–100.0)
MCV: 85.6 fL (ref 78.0–100.0)
PLATELETS: 146 10*3/uL — AB (ref 150–400)
Platelets: 162 10*3/uL (ref 150–400)
RBC: 3.35 MIL/uL — AB (ref 4.22–5.81)
RBC: 3.12 MIL/uL — AB (ref 4.22–5.81)
RDW: 13.8 % (ref 11.5–15.5)
RDW: 14 % (ref 11.5–15.5)
WBC: 8.8 10*3/uL (ref 4.0–10.5)
WBC: 7.8 10*3/uL (ref 4.0–10.5)

## 2016-12-18 LAB — BASIC METABOLIC PANEL
ANION GAP: 6 (ref 5–15)
BUN: 13 mg/dL (ref 6–20)
CALCIUM: 8.3 mg/dL — AB (ref 8.9–10.3)
CO2: 27 mmol/L (ref 22–32)
CREATININE: 1.13 mg/dL (ref 0.61–1.24)
Chloride: 103 mmol/L (ref 101–111)
Glucose, Bld: 114 mg/dL — ABNORMAL HIGH (ref 65–99)
Potassium: 3.9 mmol/L (ref 3.5–5.1)
SODIUM: 136 mmol/L (ref 135–145)

## 2016-12-18 NOTE — Care Management (Addendum)
Case manager spoke with patient and his wife concerning discharge plans. They are from [place], [place]. Mr. Dareen Pianonderson says he wants to return to [place] and [facility]. Case manager and social worker will work on facilitating this after patient has surgery on 12/21/16.  [facility], [address], [place], [place] [id number], [phone], Admissions office will be open on Monday to assign liaison to review patient's needs.
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
## 2016-12-18 NOTE — Progress Notes (Signed)
Not[facility] pt and [facility]. [phone]

## 2016-12-18 NOTE — Plan of Care (Signed)
Problem: Safety: Goal: Ability to remain free from injury will improve Call bell within reach. Personal belongings within reach.

## 2016-12-19 ENCOUNTER — Encounter (HOSPITAL_COMMUNITY): Payer: Self-pay | Admitting: Orthopedic Surgery

## 2016-12-19 DIAGNOSIS — T148XXA Other injury of unspecified body region, initial encounter: Secondary | ICD-10-CM

## 2016-12-19 HISTORY — DX: Other injury of unspecified body region, initial encounter: T14.8XXA

## 2016-12-19 LAB — BASIC METABOLIC PANEL
ANION GAP: 7 (ref 5–15)
BUN: 9 mg/dL (ref 6–20)
CO2: 29 mmol/L (ref 22–32)
Calcium: 8.5 mg/dL — ABNORMAL LOW (ref 8.9–10.3)
Chloride: 103 mmol/L (ref 101–111)
Creatinine, Ser: 0.98 mg/dL (ref 0.61–1.24)
Glucose, Bld: 108 mg/dL — ABNORMAL HIGH (ref 65–99)
POTASSIUM: 3.9 mmol/L (ref 3.5–5.1)
SODIUM: 139 mmol/L (ref 135–145)

## 2016-12-19 MED ORDER — METHOCARBAMOL 1000 MG/10ML IJ SOLN
500.0000 mg | Freq: Four times a day (QID) | INTRAVENOUS | Status: DC | PRN
  Filled 2016-12-19: qty 5

## 2016-12-19 MED ORDER — METHOCARBAMOL 500 MG PO TABS
500.0000 mg | ORAL_TABLET | Freq: Four times a day (QID) | ORAL | Status: DC | PRN
  Administered 2016-12-19 – 2016-12-23 (×11): 1000 mg via ORAL
  Filled 2016-12-19 (×12): qty 2
  Filled 2016-12-19: qty 1
  Filled 2016-12-19: qty 2
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
## 2016-12-20 LAB — CBC
HCT: 26 % — ABNORMAL LOW (ref 39.0–52.0)
Hemoglobin: 8.6 g/dL — ABNORMAL LOW (ref 13.0–17.0)
MCH: 28 pg (ref 26.0–34.0)
MCHC: 33.1 g/dL (ref 30.0–36.0)
MCV: 84.7 fL (ref 78.0–100.0)
PLATELETS: 195 10*3/uL (ref 150–400)
RBC: 3.07 MIL/uL — AB (ref 4.22–5.81)
RDW: 13.7 % (ref 11.5–15.5)
WBC: 8.2 10*3/uL (ref 4.0–10.5)

## 2016-12-20 MED ORDER — TRANEXAMIC ACID 1000 MG/10ML IV SOLN
1000.0000 mg | INTRAVENOUS | Status: DC
  Filled 2016-12-20 (×2): qty 10

## 2016-12-20 NOTE — Progress Notes (Signed)
PT Cancellation Note  Patient Details Name: Jordan LenisDerrick Daigneault MRN: [id number] DOB: 03/21/1976   Cancelled Treatment:    Reason Eval/Treat Not Completed: (P) Fatigue/lethargy limiting ability to participate (Pt reports he hasn't slept all day and he is just now getting rest.  Informed patient that PT may not be able to come back.  Will f/u if time permits.  )   Nassir Neidert Artis DelayJ Zalaya Astarita 12/20/2016, 2:19 PM  Joycelyn RuaAimee Monti Villers, PTA pager [phone]

## 2016-12-20 NOTE — Social Work (Signed)
CSW received [facility] [place] [facility] and she indicated that they are reviewing clinicals and will get back to CSW. CSW provided information on diet, height and weight for SNF.  CSW will continue to f/u.
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
## 2016-12-20 NOTE — Social Work (Signed)
[facility] and spoke with Sydell Axonlaina [phone]) in admissions. CSW will fax [phone]) clinicals to be reviewed.
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
## 2016-12-20 NOTE — Anesthesia Preprocedure Evaluation (Addendum)
Anesthesia Evaluation  Patient identified by MRN, date of birth, ID band Patient awake    Reviewed: Allergy & Precautions, H&P , NPO status , Patient's Chart, lab work & pertinent test results  Airway Mallampati: II  TM Distance: >3 FB Neck ROM: Full    Dental no notable dental hx. (+) Teeth Intact, Dental Advisory Given   Pulmonary Current Smoker,    Pulmonary exam normal breath sounds clear to auscultation       Cardiovascular Exercise Tolerance: Good negative cardio ROS   Rhythm:Regular Rate:Normal     Neuro/Psych negative neurological ROS  negative psych ROS   GI/Hepatic negative GI ROS, Neg liver ROS,   Endo/Other  negative endocrine ROS  Renal/GU negative Renal ROS  negative genitourinary   Musculoskeletal   Abdominal   Peds  Hematology negative hematology ROS (+)   Anesthesia Other Findings   Reproductive/Obstetrics negative OB ROS                            Anesthesia Physical Anesthesia Plan  ASA: II  Anesthesia Plan: General   Post-op Pain Management:    Induction: Intravenous  PONV Risk Score and Plan: 2 and Ondansetron, Dexamethasone, Propofol and Midazolam  Airway Management Planned: Oral ETT  Additional Equipment:   Intra-op Plan:   Post-operative Plan: Extubation in OR  Informed Consent: I have reviewed the patients History and Physical, chart, labs and discussed the procedure including the risks, benefits and alternatives for the proposed anesthesia with the patient or authorized representative who has indicated his/her understanding and acceptance.   Dental advisory given  Plan Discussed with: CRNA  Anesthesia Plan Comments:        Anesthesia Quick Evaluation
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
## 2016-12-20 NOTE — Progress Notes (Signed)
Pt's last BM was prior to admission on 6/11. He is receiving colace and senna BID and is passing gas. His abdomen is soft, and he feels that he will able to have a BM today. He has refused to take magnesium citrate this morning after being educated. Will continue to monitor.

## 2016-12-21 ENCOUNTER — Encounter (HOSPITAL_COMMUNITY): Payer: Self-pay | Admitting: Certified Registered Nurse Anesthetist

## 2016-12-21 ENCOUNTER — Inpatient Hospital Stay (HOSPITAL_COMMUNITY): Payer: 59 | Admitting: Anesthesiology

## 2016-12-21 ENCOUNTER — Inpatient Hospital Stay (HOSPITAL_COMMUNITY): Payer: 59

## 2016-12-21 ENCOUNTER — Encounter (HOSPITAL_COMMUNITY)
Admission: EM | Disposition: A | Discharge status: Home Health | Payer: Self-pay | Source: Home / Self Care | Attending: Orthopedic Surgery

## 2016-12-21 DIAGNOSIS — T1490XA Injury, unspecified, initial encounter: Secondary | ICD-10-CM | POA: Diagnosis not present

## 2016-12-21 DIAGNOSIS — S82141A Displaced bicondylar fracture of right tibia, initial encounter for closed fracture: Secondary | ICD-10-CM | POA: Diagnosis not present

## 2016-12-21 HISTORY — PX: ORIF TIBIA PLATEAU: SHX2132

## 2016-12-21 LAB — CBC
HCT: 28.5 % — ABNORMAL LOW (ref 39.0–52.0)
Hemoglobin: 9.3 g/dL — ABNORMAL LOW (ref 13.0–17.0)
MCH: 27.6 pg (ref 26.0–34.0)
MCHC: 32.6 g/dL (ref 30.0–36.0)
MCV: 84.6 fL (ref 78.0–100.0)
PLATELETS: 223 10*3/uL (ref 150–400)
RBC: 3.37 MIL/uL — AB (ref 4.22–5.81)
RDW: 13.8 % (ref 11.5–15.5)
WBC: 10 10*3/uL (ref 4.0–10.5)

## 2016-12-21 SURGERY — OPEN REDUCTION INTERNAL FIXATION (ORIF) TIBIAL PLATEAU
Anesthesia: General | Laterality: Right

## 2016-12-21 MED ORDER — ONDANSETRON HCL 4 MG/2ML IJ SOLN
INTRAMUSCULAR | Status: AC
  Filled 2016-12-21: qty 2

## 2016-12-21 MED ORDER — KETOROLAC TROMETHAMINE 30 MG/ML IJ SOLN
INTRAMUSCULAR | Status: DC | PRN
  Administered 2016-12-21: 30 mg via INTRAVENOUS

## 2016-12-21 MED ORDER — BUPIVACAINE HCL 0.25 % IJ SOLN
INTRAMUSCULAR | Status: DC | PRN
  Administered 2016-12-21: 30 mL

## 2016-12-21 MED ORDER — FENTANYL CITRATE (PF) 100 MCG/2ML IJ SOLN
INTRAMUSCULAR | Status: DC | PRN
  Administered 2016-12-21 (×3): 100 ug via INTRAVENOUS
  Administered 2016-12-21: 50 ug via INTRAVENOUS
  Administered 2016-12-21: 100 ug via INTRAVENOUS
  Administered 2016-12-21: 50 ug via INTRAVENOUS

## 2016-12-21 MED ORDER — HYDROMORPHONE HCL 1 MG/ML IJ SOLN
INTRAMUSCULAR | Status: AC
  Filled 2016-12-21: qty 0.5

## 2016-12-21 MED ORDER — ACETAMINOPHEN 10 MG/ML IV SOLN
INTRAVENOUS | Status: AC
  Filled 2016-12-21: qty 100

## 2016-12-21 MED ORDER — HYDROMORPHONE HCL 1 MG/ML IJ SOLN
0.5000 mg | INTRAMUSCULAR | Status: DC | PRN
  Administered 2016-12-21: 1 mg via INTRAVENOUS
  Filled 2016-12-21: qty 1

## 2016-12-21 MED ORDER — ONDANSETRON HCL 4 MG/2ML IJ SOLN
INTRAMUSCULAR | Status: DC | PRN
  Administered 2016-12-21: 4 mg via INTRAVENOUS

## 2016-12-21 MED ORDER — CEFAZOLIN SODIUM-DEXTROSE 2-4 GM/100ML-% IV SOLN
2.0000 g | Freq: Four times a day (QID) | INTRAVENOUS | Status: AC
  Administered 2016-12-21 – 2016-12-22 (×3): 2 g via INTRAVENOUS
  Filled 2016-12-21 (×3): qty 100

## 2016-12-21 MED ORDER — MIDAZOLAM HCL 2 MG/2ML IJ SOLN
INTRAMUSCULAR | Status: AC
  Filled 2016-12-21: qty 2

## 2016-12-21 MED ORDER — LIDOCAINE HCL (CARDIAC) 20 MG/ML IV SOLN
INTRAVENOUS | Status: DC | PRN
  Administered 2016-12-21: 60 mg via INTRAVENOUS

## 2016-12-21 MED ORDER — KETOROLAC TROMETHAMINE 30 MG/ML IJ SOLN
INTRAMUSCULAR | Status: AC
  Filled 2016-12-21: qty 1

## 2016-12-21 MED ORDER — ROCURONIUM BROMIDE 10 MG/ML (PF) SYRINGE
PREFILLED_SYRINGE | INTRAVENOUS | Status: AC
  Filled 2016-12-21: qty 5

## 2016-12-21 MED ORDER — PROPOFOL 10 MG/ML IV BOLUS
INTRAVENOUS | Status: DC | PRN
  Administered 2016-12-21: 150 mg via INTRAVENOUS

## 2016-12-21 MED ORDER — LIDOCAINE 2% (20 MG/ML) 5 ML SYRINGE
INTRAMUSCULAR | Status: AC
  Filled 2016-12-21: qty 5

## 2016-12-21 MED ORDER — LACTATED RINGERS IV SOLN
INTRAVENOUS | Status: DC
  Administered 2016-12-21 – 2016-12-22 (×2): via INTRAVENOUS

## 2016-12-21 MED ORDER — KETOROLAC TROMETHAMINE 15 MG/ML IJ SOLN
15.0000 mg | Freq: Four times a day (QID) | INTRAMUSCULAR | Status: AC | PRN
  Administered 2016-12-21: 15 mg via INTRAVENOUS
  Filled 2016-12-21: qty 1

## 2016-12-21 MED ORDER — FENTANYL CITRATE (PF) 250 MCG/5ML IJ SOLN
INTRAMUSCULAR | Status: AC
  Filled 2016-12-21: qty 5

## 2016-12-21 MED ORDER — OXYCODONE HCL 5 MG PO TABS
5.0000 mg | ORAL_TABLET | ORAL | Status: DC | PRN
  Administered 2016-12-21 – 2016-12-24 (×14): 15 mg via ORAL
  Filled 2016-12-21 (×14): qty 3

## 2016-12-21 MED ORDER — CEFAZOLIN SODIUM-DEXTROSE 2-4 GM/100ML-% IV SOLN
INTRAVENOUS | Status: AC
  Filled 2016-12-21: qty 100

## 2016-12-21 MED ORDER — ACETAMINOPHEN 10 MG/ML IV SOLN
INTRAVENOUS | Status: DC | PRN
  Administered 2016-12-21: 1000 mg via INTRAVENOUS

## 2016-12-21 MED ORDER — PROPOFOL 10 MG/ML IV BOLUS
INTRAVENOUS | Status: AC
  Filled 2016-12-21: qty 20

## 2016-12-21 MED ORDER — ACETAMINOPHEN 500 MG PO TABS
1000.0000 mg | ORAL_TABLET | Freq: Three times a day (TID) | ORAL | Status: AC
  Administered 2016-12-21 – 2016-12-23 (×6): 1000 mg via ORAL
  Filled 2016-12-21 (×6): qty 2

## 2016-12-21 MED ORDER — BUPIVACAINE HCL (PF) 0.25 % IJ SOLN
INTRAMUSCULAR | Status: AC
  Filled 2016-12-21: qty 30

## 2016-12-21 MED ORDER — HYDROMORPHONE HCL 1 MG/ML IJ SOLN
0.2500 mg | INTRAMUSCULAR | Status: DC | PRN
  Administered 2016-12-21 (×2): 0.5 mg via INTRAVENOUS

## 2016-12-21 MED ORDER — 0.9 % SODIUM CHLORIDE (POUR BTL) OPTIME
TOPICAL | Status: DC | PRN
  Administered 2016-12-21: 1000 mL

## 2016-12-21 MED ORDER — CEFAZOLIN SODIUM-DEXTROSE 2-3 GM-% IV SOLR
INTRAVENOUS | Status: DC | PRN
Start: 2016-12-21 — End: 2016-12-21
  Administered 2016-12-21: 2 g via INTRAVENOUS

## 2016-12-21 MED ORDER — SUGAMMADEX SODIUM 200 MG/2ML IV SOLN
INTRAVENOUS | Status: DC | PRN
Start: 2016-12-21 — End: 2016-12-21
  Administered 2016-12-21: 200 mg via INTRAVENOUS

## 2016-12-21 MED ORDER — ROCURONIUM BROMIDE 100 MG/10ML IV SOLN
INTRAVENOUS | Status: DC | PRN
  Administered 2016-12-21: 50 mg via INTRAVENOUS

## 2016-12-21 MED ORDER — LABETALOL HCL 5 MG/ML IV SOLN
INTRAVENOUS | Status: DC | PRN
  Administered 2016-12-21 (×2): 5 mg via INTRAVENOUS

## 2016-12-21 SURGICAL SUPPLY — 78 items
BANDAGE ACE 4X5 VEL STRL LF (GAUZE/BANDAGES/DRESSINGS) IMPLANT
BANDAGE ACE 6X5 VEL STRL LF (GAUZE/BANDAGES/DRESSINGS) ×2 IMPLANT
BANDAGE ESMARK 6X9 LF (GAUZE/BANDAGES/DRESSINGS) ×1 IMPLANT
BIT DRILL 2.5X2.75 QC CALB (BIT) ×2 IMPLANT
BIT DRILL SLEEVE MEASURING (BIT) ×1 IMPLANT
BLADE SURG 10 STRL SS (BLADE) IMPLANT
BLADE SURG 15 STRL LF DISP TIS (BLADE) IMPLANT
BLADE SURG 15 STRL SS (BLADE)
BNDG COHESIVE 4X5 TAN STRL (GAUZE/BANDAGES/DRESSINGS) ×2 IMPLANT
BNDG ESMARK 6X9 LF (GAUZE/BANDAGES/DRESSINGS) ×2
BNDG GAUZE ELAST 4 BULKY (GAUZE/BANDAGES/DRESSINGS) ×4 IMPLANT
COVER MAYO STAND STRL (DRAPES) IMPLANT
COVER SURGICAL LIGHT HANDLE (MISCELLANEOUS) ×2 IMPLANT
CUFF TOURNIQUET SINGLE 34IN LL (TOURNIQUET CUFF) IMPLANT
DRAPE C-ARM 42X72 X-RAY (DRAPES) ×2 IMPLANT
DRAPE C-ARMOR (DRAPES) IMPLANT
DRAPE IMP U-DRAPE 54X76 (DRAPES) IMPLANT
DRAPE INCISE IOBAN 66X45 STRL (DRAPES) ×2 IMPLANT
DRAPE U-SHAPE 47X51 STRL (DRAPES) IMPLANT
DRILL BIT 2.5X100 214235007 (MISCELLANEOUS) ×2 IMPLANT
DRILL BIT 2.7X100 214235006 DU (MISCELLANEOUS) ×2 IMPLANT
DRILL SLEEVE MEASURING (BIT) ×2
DRSG PAD ABDOMINAL 8X10 ST (GAUZE/BANDAGES/DRESSINGS) IMPLANT
ELECT REM PT RETURN 9FT ADLT (ELECTROSURGICAL) ×2
ELECTRODE REM PT RTRN 9FT ADLT (ELECTROSURGICAL) ×1 IMPLANT
GAUZE SPONGE 4X4 12PLY STRL (GAUZE/BANDAGES/DRESSINGS) ×2 IMPLANT
GLOVE BIO SURGEON STRL SZ7.5 (GLOVE) ×4 IMPLANT
GLOVE BIOGEL PI IND STRL 8 (GLOVE) ×2 IMPLANT
GLOVE BIOGEL PI INDICATOR 8 (GLOVE) ×2
GOWN STRL REUS W/ TWL LRG LVL3 (GOWN DISPOSABLE) ×3 IMPLANT
GOWN STRL REUS W/TWL LRG LVL3 (GOWN DISPOSABLE) ×3
IMMOBILIZER KNEE 22 UNIV (SOFTGOODS) IMPLANT
K-WIRE ACE 1.6X6 (WIRE) ×8
KIT BASIN OR (CUSTOM PROCEDURE TRAY) ×2 IMPLANT
KIT ROOM TURNOVER OR (KITS) ×2 IMPLANT
KWIRE ACE 1.6X6 (WIRE) ×4 IMPLANT
MANIFOLD NEPTUNE II (INSTRUMENTS) ×2 IMPLANT
NDL SUT 6 .5 CRC .975X.05 MAYO (NEEDLE) IMPLANT
NEEDLE MAYO TAPER (NEEDLE)
NS IRRIG 1000ML POUR BTL (IV SOLUTION) ×2 IMPLANT
PACK ORTHO EXTREMITY (CUSTOM PROCEDURE TRAY) ×2 IMPLANT
PAD ARMBOARD 7.5X6 YLW CONV (MISCELLANEOUS) IMPLANT
PLATE LOCK 7H STD RT PROX TIB (Plate) ×2 IMPLANT
PLATE TUB 100DEG 5 HO (Plate) ×2 IMPLANT
PUTTY DBM STAGRAFT PLUS 5CC (Putty) ×2 IMPLANT
SCREW 3.5X85 LOPROFILE (Screw) ×2 IMPLANT
SCREW CORTICAL 3.5MM  28MM (Screw) ×1 IMPLANT
SCREW CORTICAL 3.5MM  34MM (Screw) ×2 IMPLANT
SCREW CORTICAL 3.5MM  42MM (Screw) ×2 IMPLANT
SCREW CORTICAL 3.5MM 28MM (Screw) ×1 IMPLANT
SCREW CORTICAL 3.5MM 34MM (Screw) ×2 IMPLANT
SCREW CORTICAL 3.5MM 38MM (Screw) ×4 IMPLANT
SCREW CORTICAL 3.5MM 42MM (Screw) ×2 IMPLANT
SCREW LOCK 3.5X65 816135065 (Screw) ×2 IMPLANT
SCREW LOCK 3.5X70 816135070 (Screw) ×4 IMPLANT
SCREW LOCK CORT STAR 3.5X60 (Screw) ×2 IMPLANT
SCREW LOCK CORT STAR 3.5X75 (Screw) ×6 IMPLANT
SCREW NLOCK CANC HEX 4X50 (Screw) ×2 IMPLANT
SPONGE LAP 18X18 X RAY DECT (DISPOSABLE) IMPLANT
STOCKINETTE IMPERVIOUS LG (DRAPES) ×2 IMPLANT
SUCTION FRAZIER HANDLE 10FR (MISCELLANEOUS)
SUCTION TUBE FRAZIER 10FR DISP (MISCELLANEOUS) IMPLANT
SUT ETHILON 3 0 PS 1 (SUTURE) ×2 IMPLANT
SUT FIBERWIRE #2 38 T-5 BLUE (SUTURE)
SUT FIBERWIRE 2-0 18 17.9 3/8 (SUTURE)
SUT MNCRL AB 4-0 PS2 18 (SUTURE) IMPLANT
SUT MON AB 2-0 CT1 36 (SUTURE) ×4 IMPLANT
SUT VIC AB 0 CT1 27 (SUTURE) ×1
SUT VIC AB 0 CT1 27XBRD ANBCTR (SUTURE) ×1 IMPLANT
SUTURE FIBERWR #2 38 T-5 BLUE (SUTURE) IMPLANT
SUTURE FIBERWR 2-0 18 17.9 3/8 (SUTURE) IMPLANT
TOWEL OR 17X24 6PK STRL BLUE (TOWEL DISPOSABLE) IMPLANT
TOWEL OR 17X26 10 PK STRL BLUE (TOWEL DISPOSABLE) ×4 IMPLANT
TOWEL OR NON WOVEN STRL DISP B (DISPOSABLE) IMPLANT
TRAY FOLEY CATH SILVER 14FR (SET/KITS/TRAYS/PACK) ×2 IMPLANT
TUBE CONNECTING 12X1/4 (SUCTIONS) IMPLANT
WATER STERILE IRR 1000ML POUR (IV SOLUTION) IMPLANT
YANKAUER SUCT BULB TIP NO VENT (SUCTIONS) IMPLANT

## 2016-12-21 NOTE — Progress Notes (Signed)
I discussed with RN CM. Pt awaiting insurance authorization approval [facility]. An inpt rehab consult/[facility] involvement [facility] therefore we will hold on formal consult at this time. [phone]

## 2016-12-21 NOTE — Anesthesia Procedure Notes (Signed)
Procedure Name: Intubation Date/Time: 12/21/2016 9:27 AM Performed by: Reine JustFLOWERS, Cindy Fullman T Pre-anesthesia Checklist: Patient identified, Emergency Drugs available, Suction available, Patient being monitored and Timeout performed Patient Re-evaluated:Patient Re-evaluated prior to inductionOxygen Delivery Method: Circle system utilized and Simple face mask Preoxygenation: Pre-oxygenation with 100% oxygen Intubation Type: IV induction Ventilation: Mask ventilation without difficulty Laryngoscope Size: Miller and 3 Grade View: Grade II Tube type: Oral Tube size: 7.5 mm Number of attempts: 1 Airway Equipment and Method: Patient positioned with wedge pillow and Stylet Placement Confirmation: ETT inserted through vocal cords under direct vision,  positive ETCO2 and breath sounds checked- equal and bilateral Secured at: 23 cm Tube secured with: Tape Dental Injury: Teeth and Oropharynx as per pre-operative assessment
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
## 2016-12-21 NOTE — Transfer of Care (Signed)
Immediate Anesthesia Transfer of Care Note  Patient: Jordan Townsend  Procedure(s) Performed: Procedure(s): OPEN REDUCTION INTERNAL FIXATION (ORIF) TIBIAL PLATEAU (Right)  Patient Location: PACU  Anesthesia Type:General  Level of Consciousness: awake and alert   Airway & Oxygen Therapy: Patient Spontanous Breathing and Patient connected to nasal cannula oxygen  Post-op Assessment: Report given to RN and Post -op Vital signs reviewed and stable  Post vital signs: Reviewed and stable  Last Vitals:  Vitals:   12/21/16 0515 12/21/16 1208  BP: 126/73   Pulse: 92   Resp: 18   Temp: 37.2 C 36.2 C    Last Pain:  Vitals:   12/21/16 0515  TempSrc: Oral  PainSc:       Patients Stated Pain Goal: 2 (12/17/16 2205)  Complications: No apparent anesthesia complications

## 2016-12-21 NOTE — Anesthesia Postprocedure Evaluation (Signed)
Anesthesia Post Note  Patient: Jordan Townsend  Procedure(s) Performed: Procedure(s) (LRB): OPEN REDUCTION INTERNAL FIXATION (ORIF) TIBIAL PLATEAU (Right)     Patient location during evaluation: PACU Anesthesia Type: General Level of consciousness: awake and alert Pain management: pain level controlled Vital Signs Assessment: post-procedure vital signs reviewed and stable Respiratory status: spontaneous breathing, nonlabored ventilation and respiratory function stable Cardiovascular status: blood pressure returned to baseline and stable Postop Assessment: no signs of nausea or vomiting Anesthetic complications: no    Last Vitals:  Vitals:   12/21/16 1330 12/21/16 1337  BP:  (!) 145/75  Pulse: 92 93  Resp: 16 17  Temp:  36.8 C    Last Pain:  Vitals:   12/21/16 1300  TempSrc:   PainSc: Asleep                 Blanca Carreon,W. EDMOND

## 2016-12-21 NOTE — Interval H&P Note (Signed)
History and Physical Interval Note:  12/21/2016 7:38 AM  Jordan Townsend  has presented today for surgery, with the diagnosis of plateau fx  The various methods of treatment have been discussed with the patient and family. After consideration of risks, benefits and other options for treatment, the patient has consented to  Procedure(s): OPEN REDUCTION INTERNAL FIXATION (ORIF) TIBIAL PLATEAU (Right) as a surgical intervention .  The patient's history has been reviewed, patient examined, no change in status, stable for surgery.  I have reviewed the patient's chart and labs.  Questions were answered to the patient's satisfaction.     Kolby Schara D

## 2016-12-21 NOTE — Progress Notes (Signed)
Placed Xeroform/ABD/Kurlex on right elbow. Patient has hypoactive bowel sounds and has not had a bowel movement since prior to admission. Patient is refusing a laxative at this time. Will continue to educate.

## 2016-12-21 NOTE — Progress Notes (Signed)
PT Cancellation Note  Patient Details Name: Baldemar LenisDerrick Luchsinger MRN: [id number] DOB: 10-27-1975   Cancelled Treatment:    Reason Eval/Treat Not Completed: (P) Patient at procedure or test/unavailable (Pt off unit for removal of external fixator.  Plan for PT session in am.  )   Luismanuel Corman Artis DelayJ Aldrin Engelhard 12/21/2016, 12:21 PM  Joycelyn RuaAimee Oneida Mckamey, PTA pager [phone]

## 2016-12-21 NOTE — Op Note (Signed)
12/15/2016 - 12/21/2016  11:32 AM  PATIENT:  Jordan Townsend    PRE-OPERATIVE DIAGNOSIS:  plateau fx  POST-OPERATIVE DIAGNOSIS:  Same  PROCEDURE:  OPEN REDUCTION INTERNAL FIXATION (ORIF) TIBIAL PLATEAU  SURGEON:  Caitriona Sundquist, Jewel BaizeIMOTHY D, MD  ASSISTANT: Aquilla HackerHenry Martensen, PA-C, he was present and scrubbed throughout the case, critical for completion in a timely fashion, and for retraction, instrumentation, and closure.   ANESTHESIA:   gen  PREOPERATIVE INDICATIONS:  Jordan Townsend is a  41 y.o. male with a diagnosis of plateau fx who failed conservative measures and elected for surgical management.    The risks benefits and alternatives were discussed with the patient preoperatively including but not limited to the risks of infection, bleeding, nerve injury, cardiopulmonary complications, the need for revision surgery, among others, and the patient was willing to proceed.  OPERATIVE IMPLANTS: biomet lat locking and 1/3 antiglide plate  OPERATIVE FINDINGS: unstable fracture, meniscal tear laterally  BLOOD LOSS: 200  COMPLICATIONS: none  TOURNIQUET TIME: 90min  OPERATIVE PROCEDURE:  Patient was identified in the preoperative holding area and site was marked by me He was transported to the operating theater and placed on the table in supine position taking care to pad all bony prominences. After a preincinduction time out anesthesia was induced. The right lower extremity was prepped and draped in normal sterile fashion and a pre-incision timeout was performed. He received ancef for preoperative antibiotics.   Prior to prepping I did remove his external fixator.  After prepping and draping as per above I placed his leg over the bone foam. I made an anterior lateral approach to his proximal tibia and ruptured somebody his IT band with the fracture I elevated this off of his proximal tibia and incised his anterior compartment effectively releasing the anterior lateral compartment. After  performing this fasciotomy I then performed a sub-meniscal arthrotomy  Effort after performing the arthrotomy and noted a meniscal tear and repaired this with 2-0 FiberWire. I examine the joint he had a large fragmented been displaced laterally as able to reduce this and pinned it into place.  I then selected an anterior lateral locking plate and secured it first with a lag screw and nonlocking screws to the articular block. I then pinned it into place on the shaft took multiple x-rays and was happy with the fracture reduction I placed 2 shaft screws.  I then performed a medial incision and incised this in carpal compartment as well as the posterior compartment. I placed a anti-glide one third tubular 5 hole plate with excellent purchase of all screws preventing slide of this oblique fracture.  I then placed the remaining shaft screws on the lateral side I took multiple x-rays as happy with the placement of all hardware I did place DBX bone graft into the fracture site where there was a fair amount of comminution on the shaft.  I then closed the IT band and skin as well as the arthrotomy with layered stitches I left his fasciotomies open for decompression with future swelling.  Prior this I thoroughly irrigated his wounds his placed in sterile dressings knee immobilizer woken taken the PACU in stable condition  POST OPERATIVE PLAN: Nonweightbearing gentle range of motion on his own mobilize and chemical DVT prophylaxis

## 2016-12-21 NOTE — Social Work (Signed)
CSW received a call from [place] [phone]) [facility] advising [facility] are following up with insurance company on approval and their facility administration before offering a bed. She indicated that she has spoken with spouse to obtain more information.  CSW advised that he was in surgery today and will f/u.

## 2016-12-22 ENCOUNTER — Encounter (HOSPITAL_COMMUNITY): Payer: Self-pay | Admitting: Orthopedic Surgery

## 2016-12-22 LAB — CBC
HEMATOCRIT: 28.4 % — AB (ref 39.0–52.0)
HEMOGLOBIN: 9.2 g/dL — AB (ref 13.0–17.0)
MCH: 27.3 pg (ref 26.0–34.0)
MCHC: 32.4 g/dL (ref 30.0–36.0)
MCV: 84.3 fL (ref 78.0–100.0)
Platelets: 270 10*3/uL (ref 150–400)
RBC: 3.37 MIL/uL — ABNORMAL LOW (ref 4.22–5.81)
RDW: 13.8 % (ref 11.5–15.5)
WBC: 11.3 10*3/uL — ABNORMAL HIGH (ref 4.0–10.5)

## 2016-12-22 MED ORDER — POLYETHYLENE GLYCOL 3350 17 G PO PACK
17.0000 g | PACK | Freq: Two times a day (BID) | ORAL | Status: DC
  Administered 2016-12-22 – 2016-12-24 (×4): 17 g via ORAL
  Filled 2016-12-22 (×5): qty 1
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
## 2016-12-22 NOTE — Care Management (Signed)
Case manager spoke in depth with patient's wife concerning transporting [facility] [place]. Mrs. Dareen Pianonderson said they will be renting a [employer] for the transport. She was appreciative of time spent discussing details to make this a safe transport. Patient will need a RW . CM has ordered from [facility].

## 2016-12-22 NOTE — Progress Notes (Signed)
Orthopedic Tech Progress Note Patient Details:  Jordan LenisDerrick Townsend [birth year] [id number]  Patient ID: Jordan Leniserrick Townsend, male   DOB: [birth year], 41 y.o.   MRN: [id number]   Nikki DomCrawford, Annelie Boak 12/22/2016, 10:43 AM Called in bio-tech brace order; spoke wit Wylene MenLacey

## 2016-12-22 NOTE — Social Work (Signed)
CSW received phone call from admission at [facility] requesting additional clinicals. CSW faxed additional clinicals this morning.
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
## 2016-12-23 MED ORDER — BISACODYL 10 MG RE SUPP
10.0000 mg | Freq: Once | RECTAL | Status: AC
  Administered 2016-12-23: 10 mg via RECTAL
  Filled 2016-12-23: qty 1

## 2016-12-23 MED ORDER — MAGNESIUM CITRATE PO SOLN
1.0000 | Freq: Once | ORAL | Status: AC
  Administered 2016-12-23: 1 via ORAL
  Filled 2016-12-23: qty 296

## 2016-12-23 MED ORDER — SORBITOL 70 % PO SOLN: 30.0000 mL | Freq: Every day | ORAL | 0 refills | Status: AC | PRN
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
## 2019-03-27 IMAGING — RF DG KNEE 3 VIEWS*R*
1 series · 4 of 4 positions shown · non-contrast
Comparison: Radiographs December 15, 2016.

CLINICAL DATA: External fixation of right tibial plateau fracture.

EXAM:
DG C-ARM 61-120 MIN; RIGHT KNEE - 3 VIEW
FLUOROSCOPY TIME:  20 seconds.

[Series 1: run · 4 of 4 slices shown]
[im 1/4]
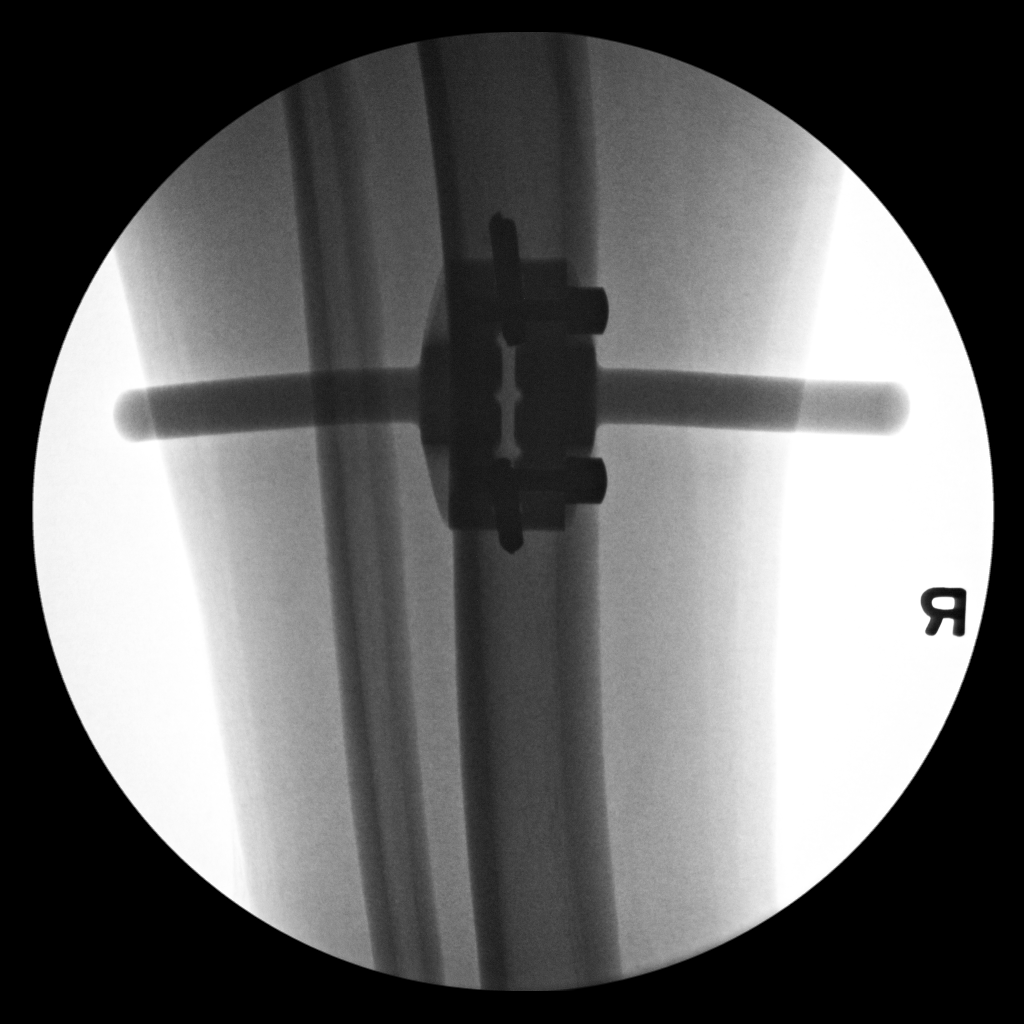
[im 2/4]
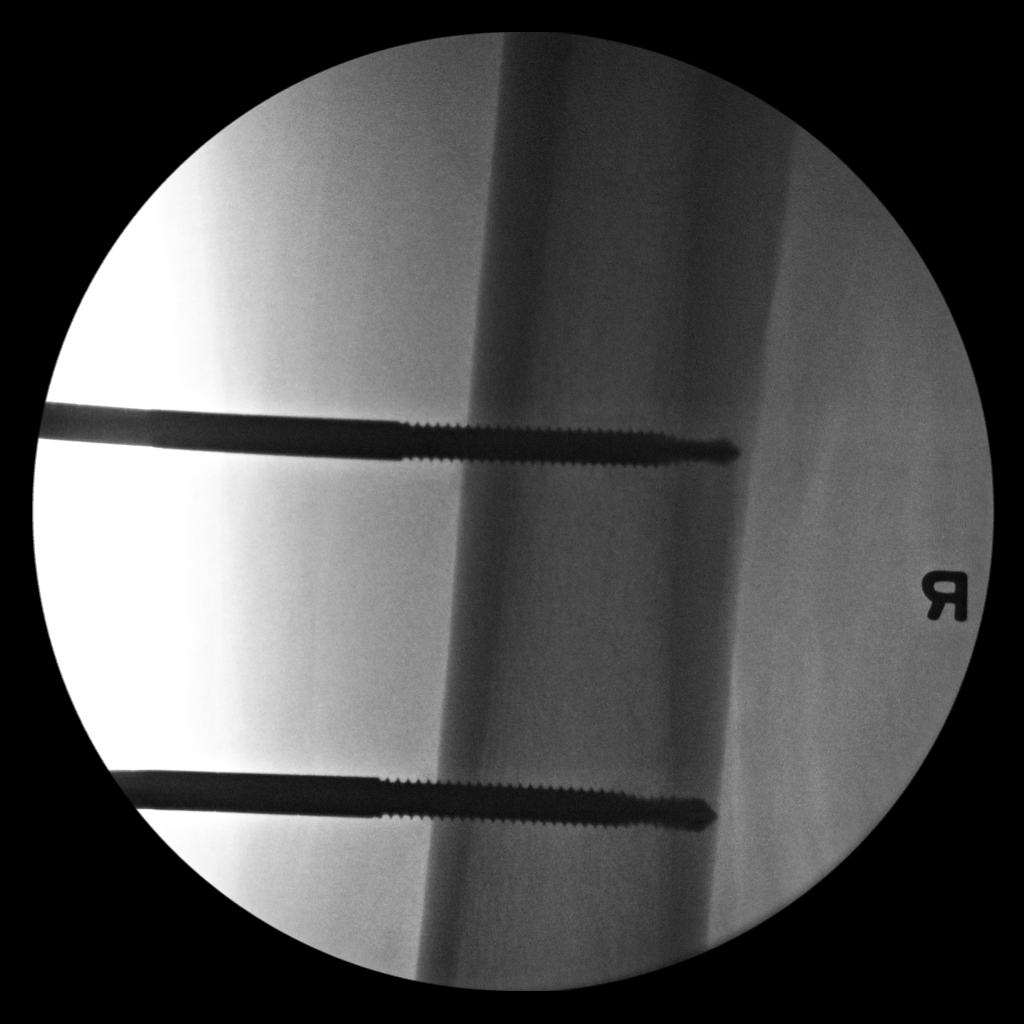
[im 3/4]
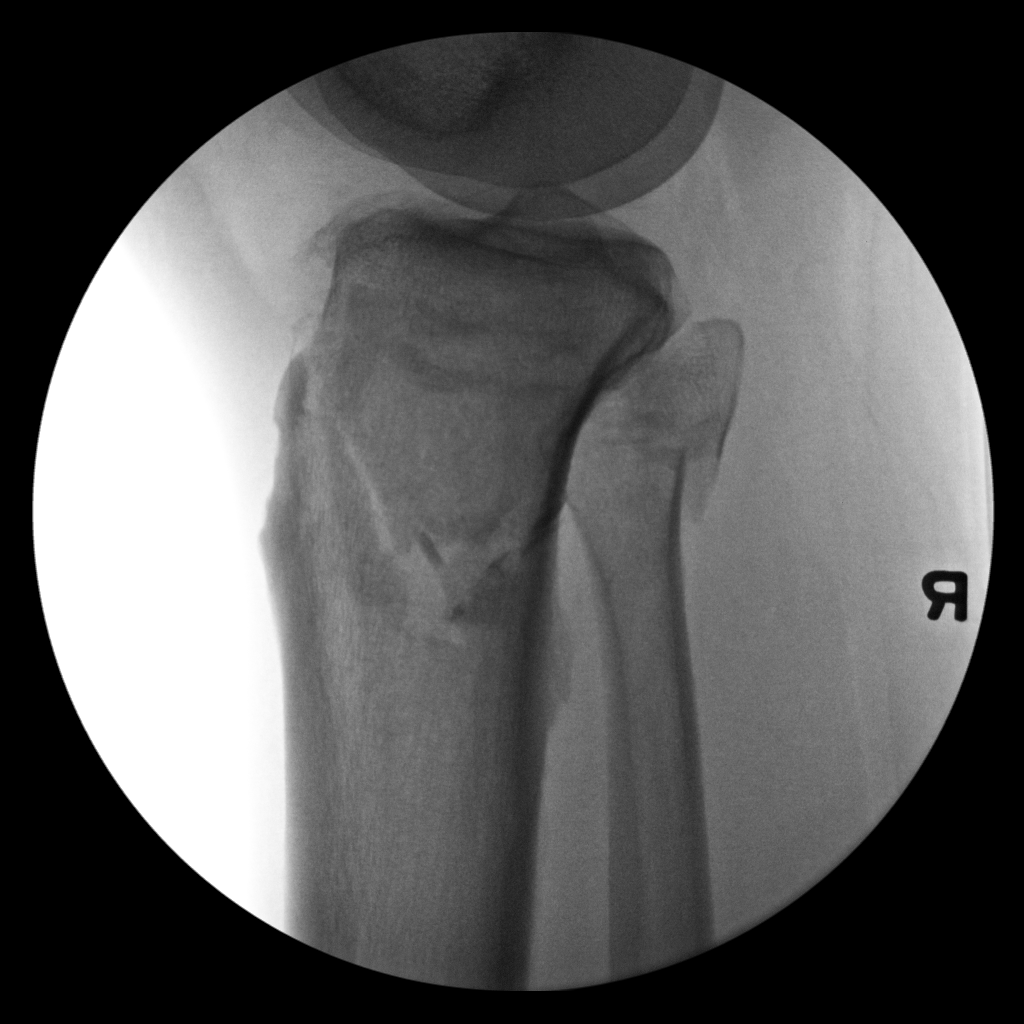
[im 4/4]
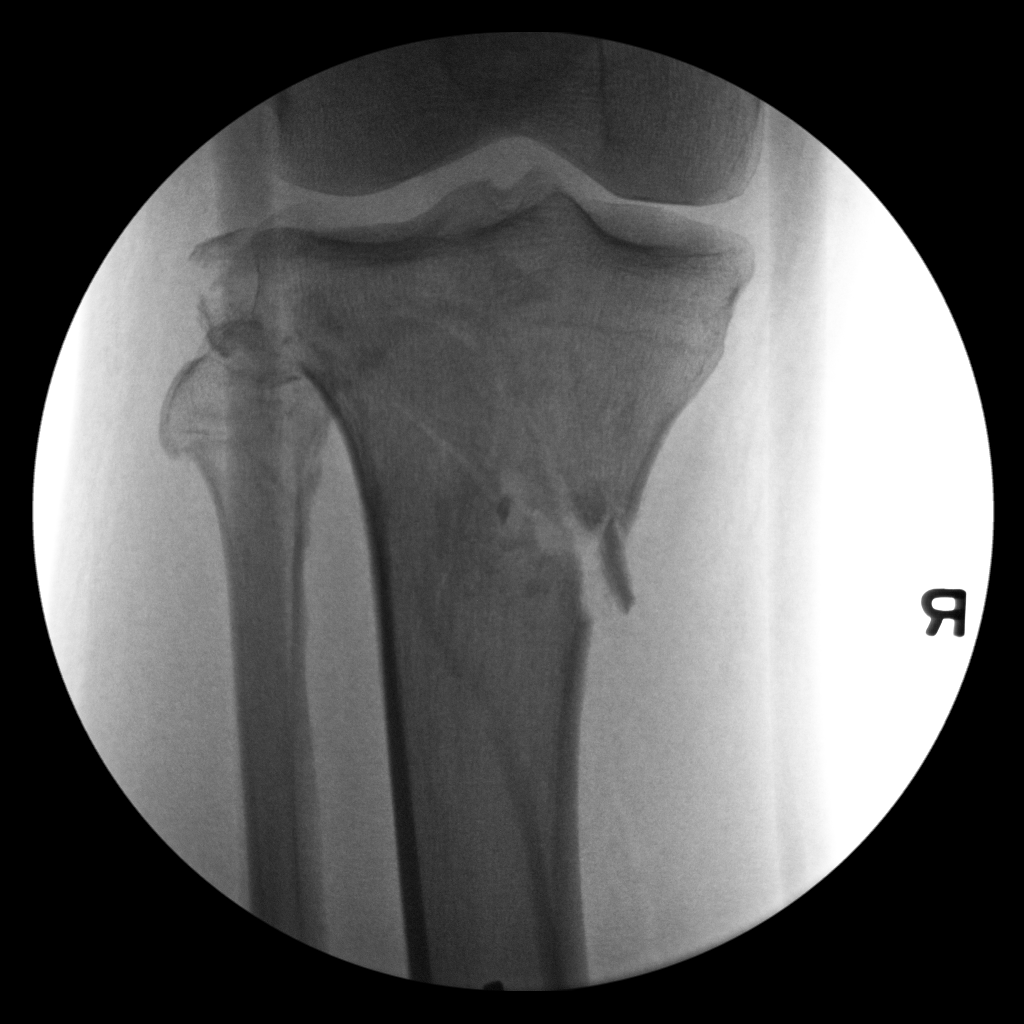

[4 of 4 positions shown; findings below may reference images not displayed]

FINDINGS: Four intraoperative fluoroscopic images of the right knee were
submitted for review. These images demonstrate external fixation
screws being passed through tibial shaft for treatment of proximal
tibial plateau fracture.
IMPRESSION: External fixation of right tibial plateau fracture.
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
## 2019-04-01 IMAGING — DX DG KNEE 1-2V PORT*R*
2 series · 2 of 2 positions shown · non-contrast
Comparison: Intraoperative fluoro spot radiographs of today's date

CLINICAL DATA: Status post tibial plating for right tibial
metadiaphyseal fracture.

EXAM:
PORTABLE RIGHT KNEE - 1-2 VIEW

[knee ap]
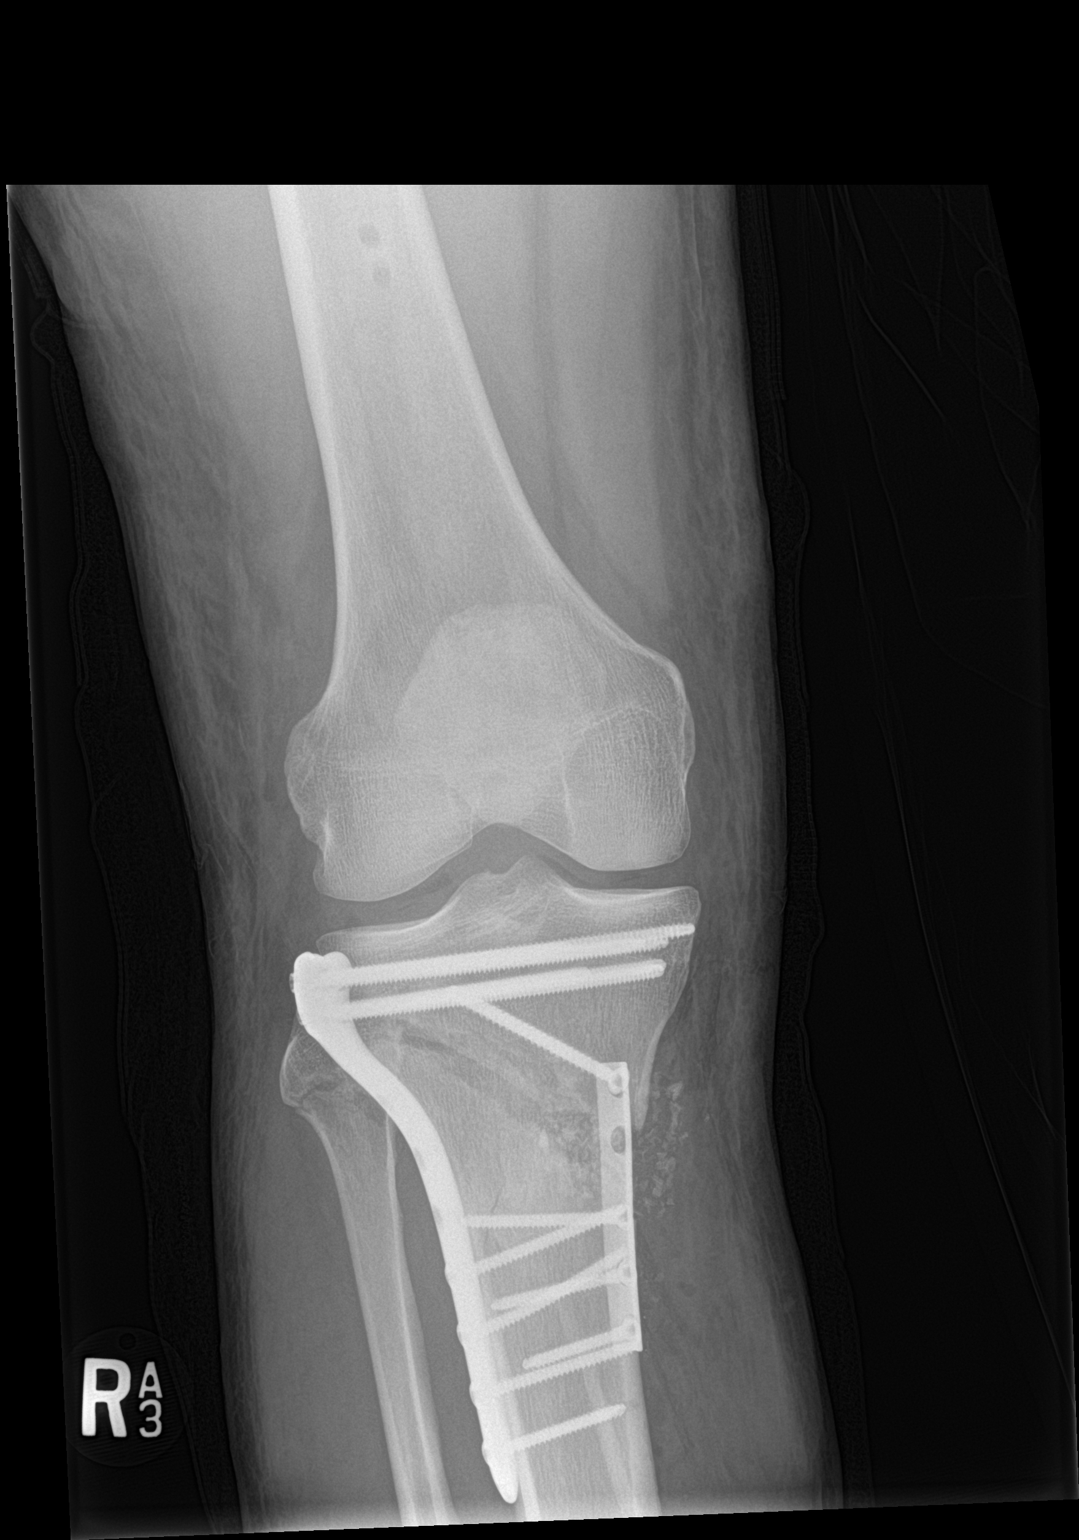

[knee lat]
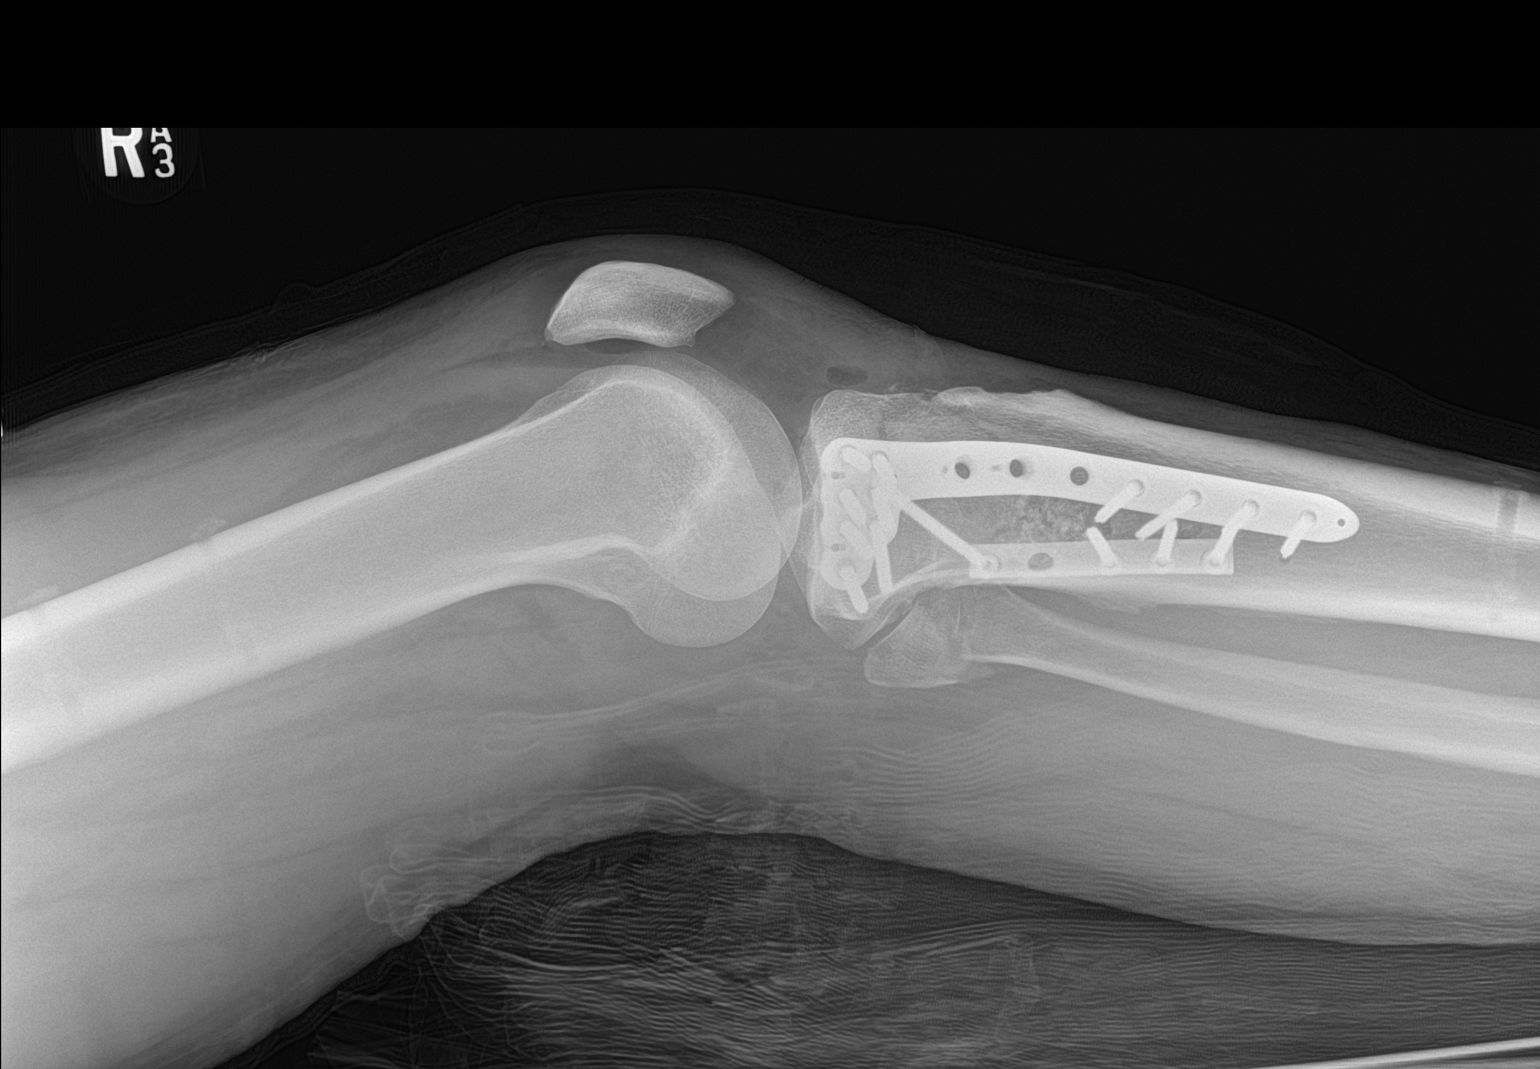

[2 of 2 positions shown; findings below may reference images not displayed]

FINDINGS: The patient has undergone ORIF for a fracture of the proximal tibial
metadiaphysis and lateral tibial plateau fracture. Tibial side
plates medially and laterally are present. There is a nondisplaced
fracture through the head of the adjacent fibula. The distal femur
appears intact.
IMPRESSION: The patient has undergone tibial plating for proximal tibial
metadiaphyseal fracture and lateral tibial plateau fracture. No
immediate postprocedure complication is observed.
